# Patient Record
Sex: Female | Born: 1960 | Race: Black or African American | Hispanic: No | Smoking: Never smoker
Health system: Southern US, Community
[De-identification: ages and names within clinical notes are randomized; demographics above are authoritative.]

## PROBLEM LIST (undated history)

## (undated) DIAGNOSIS — M1990 Unspecified osteoarthritis, unspecified site: Secondary | ICD-10-CM

## (undated) DIAGNOSIS — I10 Essential (primary) hypertension: Secondary | ICD-10-CM

## (undated) HISTORY — PX: CHOLECYSTECTOMY: SHX55

## (undated) HISTORY — PX: TUBAL LIGATION: SHX77

---

## 2009-03-11 ENCOUNTER — Emergency Department: Payer: Self-pay | Admitting: Emergency Medicine

## 2011-02-03 ENCOUNTER — Emergency Department: Payer: Self-pay | Admitting: Emergency Medicine

## 2011-11-10 ENCOUNTER — Emergency Department: Payer: Self-pay | Admitting: Emergency Medicine

## 2012-02-20 ENCOUNTER — Emergency Department: Payer: Self-pay | Admitting: Internal Medicine

## 2012-02-20 LAB — URINALYSIS, COMPLETE
Blood: NEGATIVE
Ketone: NEGATIVE
Nitrite: NEGATIVE
Ph: 7 (ref 4.5–8.0)
Protein: NEGATIVE
RBC,UR: 3 /HPF (ref 0–5)
Specific Gravity: 1.006 (ref 1.003–1.030)

## 2012-02-20 LAB — COMPREHENSIVE METABOLIC PANEL
Albumin: 3.8 g/dL (ref 3.4–5.0)
Anion Gap: 9 (ref 7–16)
BUN: 11 mg/dL (ref 7–18)
Calcium, Total: 9.5 mg/dL (ref 8.5–10.1)
Chloride: 105 mmol/L (ref 98–107)
Co2: 28 mmol/L (ref 21–32)
EGFR (African American): >60
Osmolality: 282 (ref 275–301)
Potassium: 4.4 mmol/L (ref 3.5–5.1)
SGOT(AST): 36 U/L (ref 15–37)
SGPT (ALT): 46 U/L (ref 12–78)

## 2012-02-20 LAB — CBC
HGB: 12.7 g/dL (ref 12.0–16.0)
MCH: 25.1 pg — ABNORMAL LOW (ref 26.0–34.0)
MCHC: 33 g/dL (ref 32.0–36.0)
Platelet: 430 10*3/uL (ref 150–440)
RBC: 5.06 10*6/uL (ref 3.80–5.20)
WBC: 8.5 10*3/uL (ref 3.6–11.0)

## 2012-02-20 LAB — LIPASE, BLOOD: Lipase: 155 U/L (ref 73–393)

## 2012-02-20 LAB — CLOSTRIDIUM DIFFICILE BY PCR

## 2012-07-03 ENCOUNTER — Emergency Department: Payer: Self-pay | Admitting: Emergency Medicine

## 2012-11-28 ENCOUNTER — Emergency Department: Payer: Self-pay | Admitting: Internal Medicine

## 2013-01-12 ENCOUNTER — Emergency Department: Payer: Self-pay | Admitting: Emergency Medicine

## 2013-01-12 LAB — URINALYSIS, COMPLETE
Bilirubin,UR: NEGATIVE
Ketone: NEGATIVE
Nitrite: NEGATIVE
Ph: 5 (ref 4.5–8.0)
Protein: NEGATIVE
RBC,UR: 24 /HPF (ref 0–5)
WBC UR: 199 /HPF (ref 0–5)

## 2013-06-07 ENCOUNTER — Ambulatory Visit: Payer: Self-pay | Admitting: Family

## 2013-06-13 ENCOUNTER — Ambulatory Visit: Payer: Self-pay | Admitting: Family

## 2013-06-16 ENCOUNTER — Ambulatory Visit: Payer: Self-pay | Admitting: Family

## 2013-08-08 ENCOUNTER — Ambulatory Visit: Payer: Self-pay | Admitting: Internal Medicine

## 2013-08-23 ENCOUNTER — Ambulatory Visit: Payer: Self-pay | Admitting: Internal Medicine

## 2013-09-22 ENCOUNTER — Ambulatory Visit: Payer: Self-pay | Admitting: Internal Medicine

## 2014-03-24 ENCOUNTER — Ambulatory Visit: Payer: Self-pay | Admitting: Physical Medicine and Rehabilitation

## 2014-07-13 ENCOUNTER — Other Ambulatory Visit (HOSPITAL_COMMUNITY): Payer: Self-pay | Admitting: Neurosurgery

## 2014-07-17 ENCOUNTER — Encounter (HOSPITAL_COMMUNITY): Payer: Self-pay

## 2014-07-17 ENCOUNTER — Encounter (HOSPITAL_COMMUNITY)
Admission: RE | Admit: 2014-07-17 | Discharge: 2014-07-17 | Disposition: A | Discharge status: Home / Self Care | Payer: No Typology Code available for payment source | Source: Ambulatory Visit | Attending: Neurosurgery | Admitting: Neurosurgery

## 2014-07-17 HISTORY — DX: Unspecified osteoarthritis, unspecified site: M19.90

## 2014-07-17 HISTORY — DX: Essential (primary) hypertension: I10

## 2014-07-17 LAB — TYPE AND SCREEN
ABO/RH(D): O POS
Antibody Screen: NEGATIVE

## 2014-07-17 LAB — BASIC METABOLIC PANEL
Anion gap: 9 (ref 5–15)
BUN: 9 mg/dL (ref 6–23)
CALCIUM: 9.3 mg/dL (ref 8.4–10.5)
CHLORIDE: 106 mmol/L (ref 96–112)
CO2: 25 mmol/L (ref 19–32)
CREATININE: 0.86 mg/dL (ref 0.50–1.10)
GFR calc non Af Amer: 76 mL/min — ABNORMAL LOW (ref >90)
GFR, EST AFRICAN AMERICAN: 88 mL/min — AB (ref >90)
Glucose, Bld: 82 mg/dL (ref 70–99)
Potassium: 3.7 mmol/L (ref 3.5–5.1)
Sodium: 140 mmol/L (ref 135–145)

## 2014-07-17 LAB — CBC
HEMATOCRIT: 40.9 % (ref 36.0–46.0)
Hemoglobin: 13.2 g/dL (ref 12.0–15.0)
MCH: 24.8 pg — ABNORMAL LOW (ref 26.0–34.0)
MCHC: 32.3 g/dL (ref 30.0–36.0)
MCV: 76.9 fL — ABNORMAL LOW (ref 78.0–100.0)
Platelets: 290 10*3/uL (ref 150–400)
RBC: 5.32 MIL/uL — AB (ref 3.87–5.11)
RDW: 17.2 % — ABNORMAL HIGH (ref 11.5–15.5)
WBC: 7.2 10*3/uL (ref 4.0–10.5)

## 2014-07-17 LAB — SURGICAL PCR SCREEN
MRSA, PCR: NEGATIVE
STAPHYLOCOCCUS AUREUS: NEGATIVE

## 2014-07-17 LAB — ABO/RH: ABO/RH(D): O POS

## 2014-07-17 NOTE — Pre-Procedure Instructions (Signed)
Peggy Crawford  07/17/2014   Your procedure is scheduled on:  Monday, Feb. 22nd   [facility] at 8:00 AM.             (Arrival time is per your surgeon's request)  Call this number if you have problems the morning of surgery: [phone]   Remember:   Do not eat food or drink liquids after midnight Sunday.   Take these medicines the morning of surgery with A SIP OF WATER: Neurontin   Do not wear jewelry, make-up or nail polish.  Do not wear lotions, powders, or perfumes. You may NOT wear deodorant the day of surgery.  Do not shave underarms & legs 48 hours prior to surgery.    Do not bring valuables to the hospital.  [facility] is not responsible for any belongings or valuables.               Contacts, dentures or bridgework may not be worn into surgery.  Leave suitcase in the car. After surgery it may be brought to your room.  For patients admitted to the hospital, discharge time is determined by your treatment team.               Name and phone number of your driver:    Special Instructions: :"Preparing for Surgery" instruction sheet.   Please read over the following fact sheets that you were given: Pain Booklet, Coughing and Deep Breathing, Blood Transfusion Information, MRSA Information and Surgical Site Infection Prevention

## 2014-07-17 NOTE — Progress Notes (Addendum)
Denies any cardiac problems, has never seen cardio. Her PCP is Dr. Leotis ShamesJasmine Singh @ [facility]  They do NOt have another EKG to compare with today's test.  DA

## 2014-07-18 NOTE — Progress Notes (Signed)
Anesthesia Chart Review:  Patient is a 54 year old female scheduled for L4-5 PLIF on 07/23/14 by Dr. Lovell SheehanJenkins.  History includes non-smoker, arthritis, HTN. BMI is 39, consistent with obesity/borderline morbid obesity.  PCP is Dr. Thedore MinsSingh with [facility], established care on 02/23/14.    07/17/14 EKG: NSR, possible LAE, cannot rule out anterior infarct (age undetermined). There is no comparison EKG at her PCP office, [facility], or [facility]; however, I did obtain [facility].  She has reverse R wave progression in V2-3, otherwise stable since 02/20/12. Inferior leads appear unchanged.  She denied any cardiac issues or ever seeing a cardiologist at her PAT visit. No known   Preoperative labs noted.   If no acute changes or new CV symptoms then I anticipate that she can proceed as planned.  Peggy Ochsllison Shanaiya Bene, PA-C [facility]/Anesthesiology Phone [phone] 07/18/2014 1:58 PM

## 2014-07-22 MED ORDER — CEFAZOLIN SODIUM-DEXTROSE 2-3 GM-% IV SOLR
2.0000 g | INTRAVENOUS | Status: AC
  Administered 2014-07-23: 2 g via INTRAVENOUS
  Filled 2014-07-22: qty 50

## 2014-07-23 ENCOUNTER — Encounter (HOSPITAL_COMMUNITY)
Admission: RE | Disposition: A | Discharge status: Home Health | Payer: No Typology Code available for payment source | Source: Ambulatory Visit | Attending: Neurosurgery

## 2014-07-23 ENCOUNTER — Encounter (HOSPITAL_COMMUNITY): Payer: Self-pay | Admitting: *Deleted

## 2014-07-23 ENCOUNTER — Inpatient Hospital Stay (HOSPITAL_COMMUNITY): Payer: No Typology Code available for payment source

## 2014-07-23 ENCOUNTER — Inpatient Hospital Stay (HOSPITAL_COMMUNITY)
Admission: RE | Admit: 2014-07-23 | Discharge: 2014-07-26 | DRG: 460 | Disposition: A | Discharge status: Home Health | Payer: No Typology Code available for payment source | Source: Ambulatory Visit | Attending: Neurosurgery | Admitting: Neurosurgery

## 2014-07-23 ENCOUNTER — Inpatient Hospital Stay (HOSPITAL_COMMUNITY): Payer: No Typology Code available for payment source | Admitting: Anesthesiology

## 2014-07-23 ENCOUNTER — Inpatient Hospital Stay (HOSPITAL_COMMUNITY): Payer: No Typology Code available for payment source | Admitting: Vascular Surgery

## 2014-07-23 DIAGNOSIS — I10 Essential (primary) hypertension: Secondary | ICD-10-CM | POA: Diagnosis present

## 2014-07-23 DIAGNOSIS — M5116 Intervertebral disc disorders with radiculopathy, lumbar region: Secondary | ICD-10-CM | POA: Diagnosis present

## 2014-07-23 DIAGNOSIS — M1990 Unspecified osteoarthritis, unspecified site: Secondary | ICD-10-CM | POA: Diagnosis present

## 2014-07-23 DIAGNOSIS — Z6839 Body mass index (BMI) 39.0-39.9, adult: Secondary | ICD-10-CM

## 2014-07-23 DIAGNOSIS — R509 Fever, unspecified: Secondary | ICD-10-CM

## 2014-07-23 DIAGNOSIS — M4316 Spondylolisthesis, lumbar region: Principal | ICD-10-CM | POA: Diagnosis present

## 2014-07-23 DIAGNOSIS — M4806 Spinal stenosis, lumbar region: Secondary | ICD-10-CM | POA: Diagnosis present

## 2014-07-23 DIAGNOSIS — M549 Dorsalgia, unspecified: Secondary | ICD-10-CM | POA: Diagnosis present

## 2014-07-23 DIAGNOSIS — Z419 Encounter for procedure for purposes other than remedying health state, unspecified: Secondary | ICD-10-CM

## 2014-07-23 SURGERY — POSTERIOR LUMBAR FUSION 1 LEVEL
Anesthesia: General

## 2014-07-23 MED ORDER — MIDAZOLAM HCL 2 MG/2ML IJ SOLN
INTRAMUSCULAR | Status: AC
  Filled 2014-07-23: qty 2

## 2014-07-23 MED ORDER — PHENYLEPHRINE 40 MCG/ML (10ML) SYRINGE FOR IV PUSH (FOR BLOOD PRESSURE SUPPORT)
PREFILLED_SYRINGE | INTRAVENOUS | Status: AC
  Filled 2014-07-23: qty 10

## 2014-07-23 MED ORDER — NEOSTIGMINE METHYLSULFATE 10 MG/10ML IV SOLN
INTRAVENOUS | Status: DC | PRN
  Administered 2014-07-23: 3 mg via INTRAVENOUS

## 2014-07-23 MED ORDER — DOCUSATE SODIUM 100 MG PO CAPS
100.0000 mg | ORAL_CAPSULE | Freq: Two times a day (BID) | ORAL | Status: DC
  Administered 2014-07-23 – 2014-07-26 (×6): 100 mg via ORAL
  Filled 2014-07-23 (×5): qty 1

## 2014-07-23 MED ORDER — ALUM & MAG HYDROXIDE-SIMETH 200-200-20 MG/5ML PO SUSP: 30.0000 mL | Freq: Four times a day (QID) | ORAL | Status: DC | PRN

## 2014-07-23 MED ORDER — FENTANYL CITRATE 0.05 MG/ML IJ SOLN
INTRAMUSCULAR | Status: AC
  Filled 2014-07-23: qty 5

## 2014-07-23 MED ORDER — PHENOL 1.4 % MT LIQD: 1.0000 | OROMUCOSAL | Status: DC | PRN

## 2014-07-23 MED ORDER — ONDANSETRON HCL 4 MG/2ML IJ SOLN
INTRAMUSCULAR | Status: AC
  Filled 2014-07-23: qty 2

## 2014-07-23 MED ORDER — LACTATED RINGERS IV SOLN: INTRAVENOUS | Status: DC

## 2014-07-23 MED ORDER — ROCURONIUM BROMIDE 100 MG/10ML IV SOLN
INTRAVENOUS | Status: DC | PRN
  Administered 2014-07-23: 50 mg via INTRAVENOUS

## 2014-07-23 MED ORDER — PHENYLEPHRINE HCL 10 MG/ML IJ SOLN
INTRAMUSCULAR | Status: DC | PRN
  Administered 2014-07-23 (×2): 80 ug via INTRAVENOUS
  Administered 2014-07-23: 40 ug via INTRAVENOUS
  Administered 2014-07-23: 80 ug via INTRAVENOUS
  Administered 2014-07-23 (×2): 40 ug via INTRAVENOUS

## 2014-07-23 MED ORDER — GABAPENTIN 300 MG PO CAPS
300.0000 mg | ORAL_CAPSULE | Freq: Two times a day (BID) | ORAL | Status: DC
  Administered 2014-07-23 – 2014-07-26 (×6): 300 mg via ORAL
  Filled 2014-07-23 (×7): qty 1

## 2014-07-23 MED ORDER — PROPOFOL 10 MG/ML IV BOLUS
INTRAVENOUS | Status: DC | PRN
  Administered 2014-07-23: 200 mg via INTRAVENOUS

## 2014-07-23 MED ORDER — ACETAMINOPHEN 650 MG RE SUPP: 650.0000 mg | RECTAL | Status: DC | PRN

## 2014-07-23 MED ORDER — HYDROMORPHONE HCL 1 MG/ML IJ SOLN
INTRAMUSCULAR | Status: AC
  Administered 2014-07-23: 0.5 mg via INTRAVENOUS
  Filled 2014-07-23: qty 1

## 2014-07-23 MED ORDER — LIDOCAINE HCL (CARDIAC) 20 MG/ML IV SOLN
INTRAVENOUS | Status: DC | PRN
  Administered 2014-07-23: 50 mg via INTRAVENOUS

## 2014-07-23 MED ORDER — DIAZEPAM 5 MG PO TABS
5.0000 mg | ORAL_TABLET | Freq: Four times a day (QID) | ORAL | Status: DC | PRN
  Administered 2014-07-23 – 2014-07-26 (×6): 5 mg via ORAL
  Filled 2014-07-23 (×6): qty 1

## 2014-07-23 MED ORDER — MIDAZOLAM HCL 5 MG/5ML IJ SOLN
INTRAMUSCULAR | Status: DC | PRN
  Administered 2014-07-23: 2 mg via INTRAVENOUS

## 2014-07-23 MED ORDER — PHENYLEPHRINE HCL 10 MG/ML IJ SOLN
10.0000 mg | INTRAVENOUS | Status: DC | PRN
  Administered 2014-07-23: 10 ug/min via INTRAVENOUS

## 2014-07-23 MED ORDER — HYDROCODONE-ACETAMINOPHEN 5-325 MG PO TABS
1.0000 | ORAL_TABLET | ORAL | Status: DC | PRN
  Administered 2014-07-25 – 2014-07-26 (×4): 2 via ORAL
  Filled 2014-07-23 (×4): qty 2

## 2014-07-23 MED ORDER — PROPOFOL 10 MG/ML IV BOLUS
INTRAVENOUS | Status: AC
  Filled 2014-07-23: qty 20

## 2014-07-23 MED ORDER — SUCCINYLCHOLINE CHLORIDE 20 MG/ML IJ SOLN
INTRAMUSCULAR | Status: DC | PRN
  Administered 2014-07-23: 100 mg via INTRAVENOUS

## 2014-07-23 MED ORDER — 0.9 % SODIUM CHLORIDE (POUR BTL) OPTIME
TOPICAL | Status: DC | PRN
  Administered 2014-07-23: 1000 mL

## 2014-07-23 MED ORDER — LACTATED RINGERS IV SOLN
INTRAVENOUS | Status: DC
  Administered 2014-07-23 (×2): via INTRAVENOUS

## 2014-07-23 MED ORDER — BUPIVACAINE LIPOSOME 1.3 % IJ SUSP
INTRAMUSCULAR | Status: DC | PRN
  Administered 2014-07-23: 20 mL

## 2014-07-23 MED ORDER — CEFAZOLIN SODIUM-DEXTROSE 2-3 GM-% IV SOLR
2.0000 g | Freq: Three times a day (TID) | INTRAVENOUS | Status: AC
  Administered 2014-07-23 (×2): 2 g via INTRAVENOUS
  Filled 2014-07-23 (×2): qty 50

## 2014-07-23 MED ORDER — SURGIFOAM 100 EX MISC
CUTANEOUS | Status: DC | PRN
  Administered 2014-07-23: 11:00:00 via TOPICAL

## 2014-07-23 MED ORDER — BUPIVACAINE-EPINEPHRINE (PF) 0.5% -1:200000 IJ SOLN
INTRAMUSCULAR | Status: DC | PRN
  Administered 2014-07-23: 10 mL via PERINEURAL

## 2014-07-23 MED ORDER — ACETAMINOPHEN 325 MG PO TABS: 650.0000 mg | ORAL_TABLET | ORAL | Status: DC | PRN

## 2014-07-23 MED ORDER — OXYCODONE-ACETAMINOPHEN 5-325 MG PO TABS
1.0000 | ORAL_TABLET | ORAL | Status: DC | PRN
  Administered 2014-07-23 – 2014-07-26 (×8): 2 via ORAL
  Filled 2014-07-23 (×8): qty 2

## 2014-07-23 MED ORDER — LIDOCAINE HCL (CARDIAC) 20 MG/ML IV SOLN
INTRAVENOUS | Status: AC
  Filled 2014-07-23: qty 5

## 2014-07-23 MED ORDER — SODIUM CHLORIDE 0.9 % IR SOLN
Status: DC | PRN
  Administered 2014-07-23: 11:00:00

## 2014-07-23 MED ORDER — ROCURONIUM BROMIDE 50 MG/5ML IV SOLN
INTRAVENOUS | Status: AC
  Filled 2014-07-23: qty 1

## 2014-07-23 MED ORDER — GLYCOPYRROLATE 0.2 MG/ML IJ SOLN
INTRAMUSCULAR | Status: DC | PRN
Start: 2014-07-23 — End: 2014-07-23
  Administered 2014-07-23: 0.4 mg via INTRAVENOUS

## 2014-07-23 MED ORDER — HYDROMORPHONE HCL 1 MG/ML IJ SOLN
0.2500 mg | INTRAMUSCULAR | Status: DC | PRN
Start: 2014-07-23 — End: 2014-07-23
  Administered 2014-07-23 (×2): 0.5 mg via INTRAVENOUS

## 2014-07-23 MED ORDER — ONDANSETRON HCL 4 MG/2ML IJ SOLN: 4.0000 mg | INTRAMUSCULAR | Status: DC | PRN

## 2014-07-23 MED ORDER — BACITRACIN ZINC 500 UNIT/GM EX OINT
TOPICAL_OINTMENT | CUTANEOUS | Status: DC | PRN
  Administered 2014-07-23: 1 via TOPICAL

## 2014-07-23 MED ORDER — MENTHOL 3 MG MT LOZG: 1.0000 | LOZENGE | OROMUCOSAL | Status: DC | PRN

## 2014-07-23 MED ORDER — ACETAMINOPHEN 10 MG/ML IV SOLN
1000.0000 mg | Freq: Once | INTRAVENOUS | Status: AC
  Administered 2014-07-23: 1000 mg via INTRAVENOUS

## 2014-07-23 MED ORDER — MEPERIDINE HCL 25 MG/ML IJ SOLN: 6.2500 mg | INTRAMUSCULAR | Status: DC | PRN

## 2014-07-23 MED ORDER — PROMETHAZINE HCL 25 MG/ML IJ SOLN: 6.2500 mg | INTRAMUSCULAR | Status: DC | PRN

## 2014-07-23 MED ORDER — ONDANSETRON HCL 4 MG/2ML IJ SOLN
INTRAMUSCULAR | Status: DC | PRN
  Administered 2014-07-23: 4 mg via INTRAVENOUS

## 2014-07-23 MED ORDER — MORPHINE SULFATE 2 MG/ML IJ SOLN: 1.0000 mg | INTRAMUSCULAR | Status: DC | PRN

## 2014-07-23 MED ORDER — FENTANYL CITRATE 0.05 MG/ML IJ SOLN
INTRAMUSCULAR | Status: DC | PRN
  Administered 2014-07-23: 50 ug via INTRAVENOUS
  Administered 2014-07-23: 100 ug via INTRAVENOUS
  Administered 2014-07-23 (×3): 50 ug via INTRAVENOUS

## 2014-07-23 MED ORDER — ACETAMINOPHEN 10 MG/ML IV SOLN
INTRAVENOUS | Status: AC
  Administered 2014-07-23: 1000 mg via INTRAVENOUS
  Filled 2014-07-23: qty 100

## 2014-07-23 MED ORDER — BUPIVACAINE LIPOSOME 1.3 % IJ SUSP
20.0000 mL | Freq: Once | INTRAMUSCULAR | Status: DC
  Filled 2014-07-23: qty 20

## 2014-07-23 SURGICAL SUPPLY — 66 items
BAG DECANTER FOR FLEXI CONT (MISCELLANEOUS) ×2 IMPLANT
BENZOIN TINCTURE PRP APPL 2/3 (GAUZE/BANDAGES/DRESSINGS) ×2 IMPLANT
BLADE CLIPPER SURG (BLADE) IMPLANT
BRUSH SCRUB EZ PLAIN DRY (MISCELLANEOUS) ×2 IMPLANT
BUR MATCHSTICK NEURO 3.0 LAGG (BURR) ×2 IMPLANT
BUR PRECISION FLUTE 6.0 (BURR) ×2 IMPLANT
CANISTER SUCT 3000ML PPV (MISCELLANEOUS) ×2 IMPLANT
CAP REVERE LOCKING (Cap) ×8 IMPLANT
CONT SPEC 4OZ CLIKSEAL STRL BL (MISCELLANEOUS) ×4 IMPLANT
COVER BACK TABLE 60X90IN (DRAPES) ×2 IMPLANT
DRAPE C-ARM 42X72 X-RAY (DRAPES) ×4 IMPLANT
DRAPE LAPAROTOMY 100X72X124 (DRAPES) ×2 IMPLANT
DRAPE POUCH INSTRU U-SHP 10X18 (DRAPES) ×2 IMPLANT
DRAPE PROXIMA HALF (DRAPES) ×2 IMPLANT
DRAPE SURG 17X23 STRL (DRAPES) ×8 IMPLANT
ELECT BLADE 4.0 EZ CLEAN MEGAD (MISCELLANEOUS) ×2
ELECT REM PT RETURN 9FT ADLT (ELECTROSURGICAL) ×2
ELECTRODE BLDE 4.0 EZ CLN MEGD (MISCELLANEOUS) ×1 IMPLANT
ELECTRODE REM PT RTRN 9FT ADLT (ELECTROSURGICAL) ×1 IMPLANT
EVACUATOR 1/8 PVC DRAIN (DRAIN) ×2 IMPLANT
GAUZE SPONGE 4X4 12PLY STRL (GAUZE/BANDAGES/DRESSINGS) ×2 IMPLANT
GAUZE SPONGE 4X4 16PLY XRAY LF (GAUZE/BANDAGES/DRESSINGS) ×2 IMPLANT
GLOVE BIO SURGEON STRL SZ8 (GLOVE) ×2 IMPLANT
GLOVE BIO SURGEON STRL SZ8.5 (GLOVE) ×4 IMPLANT
GLOVE ECLIPSE 7.5 STRL STRAW (GLOVE) ×4 IMPLANT
GLOVE ECLIPSE 9.0 STRL (GLOVE) ×2 IMPLANT
GLOVE EXAM NITRILE LRG STRL (GLOVE) IMPLANT
GLOVE EXAM NITRILE MD LF STRL (GLOVE) IMPLANT
GLOVE EXAM NITRILE XL STR (GLOVE) IMPLANT
GLOVE EXAM NITRILE XS STR PU (GLOVE) IMPLANT
GLOVE INDICATOR 7.0 STRL GRN (GLOVE) ×4 IMPLANT
GLOVE INDICATOR 7.5 STRL GRN (GLOVE) ×4 IMPLANT
GLOVE INDICATOR 8.5 STRL (GLOVE) ×2 IMPLANT
GLOVE SS BIOGEL STRL SZ 8 (GLOVE) ×2 IMPLANT
GLOVE SUPERSENSE BIOGEL SZ 8 (GLOVE) ×2
GLOVE SURG SS PI 7.0 STRL IVOR (GLOVE) ×6 IMPLANT
GOWN STRL REUS W/ TWL LRG LVL3 (GOWN DISPOSABLE) IMPLANT
GOWN STRL REUS W/ TWL XL LVL3 (GOWN DISPOSABLE) ×4 IMPLANT
GOWN STRL REUS W/TWL 2XL LVL3 (GOWN DISPOSABLE) IMPLANT
GOWN STRL REUS W/TWL LRG LVL3 (GOWN DISPOSABLE)
GOWN STRL REUS W/TWL XL LVL3 (GOWN DISPOSABLE) ×4
KIT BASIN OR (CUSTOM PROCEDURE TRAY) ×2 IMPLANT
KIT ROOM TURNOVER OR (KITS) ×2 IMPLANT
NEEDLE HYPO 21X1.5 SAFETY (NEEDLE) ×2 IMPLANT
NEEDLE HYPO 22GX1.5 SAFETY (NEEDLE) ×2 IMPLANT
NS IRRIG 1000ML POUR BTL (IV SOLUTION) ×2 IMPLANT
PACK LAMINECTOMY NEURO (CUSTOM PROCEDURE TRAY) ×2 IMPLANT
PAD ARMBOARD 7.5X6 YLW CONV (MISCELLANEOUS) ×6 IMPLANT
PATTIES SURGICAL .5 X1 (DISPOSABLE) IMPLANT
ROD REVERE 6.35 45MM (Rod) ×4 IMPLANT
SCREW 7.5X45MM (Screw) ×8 IMPLANT
SPACER ALTERA 10X31 10-14-8 (Spacer) ×2 IMPLANT
SPONGE LAP 4X18 X RAY DECT (DISPOSABLE) IMPLANT
SPONGE NEURO XRAY DETECT 1X3 (DISPOSABLE) IMPLANT
SPONGE SURGIFOAM ABS GEL 100 (HEMOSTASIS) ×2 IMPLANT
STRIP BIOACTIVE 20CC 25X100X8 (Miscellaneous) ×2 IMPLANT
STRIP CLOSURE SKIN 1/2X4 (GAUZE/BANDAGES/DRESSINGS) ×2 IMPLANT
SUT VIC AB 1 CT1 18XBRD ANBCTR (SUTURE) ×2 IMPLANT
SUT VIC AB 1 CT1 8-18 (SUTURE) ×2
SUT VIC AB 2-0 CP2 18 (SUTURE) ×4 IMPLANT
SYR 20CC LL (SYRINGE) ×2 IMPLANT
SYR 20ML ECCENTRIC (SYRINGE) ×2 IMPLANT
TOWEL OR 17X24 6PK STRL BLUE (TOWEL DISPOSABLE) ×2 IMPLANT
TOWEL OR 17X26 10 PK STRL BLUE (TOWEL DISPOSABLE) ×2 IMPLANT
TRAY FOLEY CATH 14FRSI W/METER (CATHETERS) ×2 IMPLANT
WATER STERILE IRR 1000ML POUR (IV SOLUTION) ×2 IMPLANT

## 2014-07-23 NOTE — Anesthesia Postprocedure Evaluation (Signed)
Anesthesia Post Note  Patient: Peggy Crawford  Procedure(s) Performed: Procedure(s) (LRB): POSTERIOR LUMBAR INTERBODY FUSION POSTERIOR LATERAL ARTHRODESIS AND POSTERIOR NONSEGMENTAL INSTRUMENTATION LUMBAR FOUR-FIVE (N/A)  Anesthesia type: General  Patient location: PACU  Post pain: Pain level controlled  Post assessment: Post-op Vital signs reviewed  Last Vitals: BP 119/83 mmHg  Pulse 102  Temp(Src) 36.6 C (Oral)  Resp 14  Ht 5' 2.5" (1.588 m)  Wt 219 lb (99.338 kg)  BMI 39.39 kg/m2  SpO2 95%  LMP 10/30/2013 (Approximate)  Post vital signs: Reviewed  Level of consciousness: sedated  Complications: No apparent anesthesia complications

## 2014-07-23 NOTE — H&P (Signed)
Subjective: The patient is a 54 year old black female who has complained of chronic back and leg pain. She has failed medical management and was worked up with a lumbar MRI. This demonstrated a L4-5 spondylolisthesis. I discussed the various treatment options with the patient including surgery. She has weighed the risks, benefits, and alternative surgery and decided proceed with a L4-5 decompression, instrumentation, and fusion.   Past Medical History  Diagnosis Date  . Arthritis   . Hypertension     pcp noted her bp was elevated, but per the patient has not been placed on anything    Past Surgical History  Procedure Laterality Date  . Tubal ligation    . Cholecystectomy      No Known Allergies  History  Substance Use Topics  . Smoking status: Never Smoker   . Smokeless tobacco: Not on file  . Alcohol Use: No    History reviewed. No pertinent family history. Prior to Admission medications   Medication Sig Start Date End Date Taking? Authorizing Provider  diclofenac (VOLTAREN) 75 MG EC tablet Take 75 mg by mouth 2 (two) times daily. 04/16/14  Yes Historical Provider, MD  gabapentin (NEURONTIN) 300 MG capsule Take 300 mg by mouth 2 (two) times daily. 06/26/14  Yes Historical Provider, MD     Review of Systems  Positive ROS: As above  All other systems have been reviewed and were otherwise negative with the exception of those mentioned in the HPI and as above.  Objective: Vital signs in last 24 hours: Temp:  [97.7 F (36.5 C)] 97.7 F (36.5 C) (02/22 0807) Pulse Rate:  [90] 90 (02/22 0807) Resp:  [20] 20 (02/22 0807) BP: (127)/(77) 127/77 mmHg (02/22 0807) SpO2:  [99 %] 99 % (02/22 0807) Weight:  [99.338 kg (219 lb)] 99.338 kg (219 lb) (02/22 0807)  General Appearance: Alert, cooperative, no distress, Head: Normocephalic, without obvious abnormality, atraumatic Eyes: PERRL, conjunctiva/corneas clear, EOM's intact,    Ears: Normal  Throat: Normal  Neck: Supple,  symmetrical, trachea midline, no adenopathy; thyroid: No enlargement/tenderness/nodules; no carotid bruit or JVD Back: Symmetric, no curvature, ROM normal, no CVA tenderness Lungs: Clear to auscultation bilaterally, respirations unlabored Heart: Regular rate and rhythm, no murmur, rub or gallop Abdomen: Soft, non-tender,, no masses, no organomegaly Extremities: Extremities normal, atraumatic, no cyanosis or edema Pulses: 2+ and symmetric all extremities Skin: Skin color, texture, turgor normal, no rashes or lesions  NEUROLOGIC:   Mental status: alert and oriented, no aphasia, good attention span, Fund of knowledge/ memory ok Motor Exam - grossly normal Sensory Exam - grossly normal Reflexes:  Coordination - grossly normal Gait - grossly normal Balance - grossly normal Cranial Nerves: I: smell Not tested  II: visual acuity  OS: Normal  OD: Normal   II: visual fields Full to confrontation  II: pupils Equal, round, reactive to light  III,VII: ptosis None  III,IV,VI: extraocular muscles  Full ROM  V: mastication Normal  V: facial light touch sensation  Normal  V,VII: corneal reflex  Present  VII: facial muscle function - upper  Normal  VII: facial muscle function - lower Normal  VIII: hearing Not tested  IX: soft palate elevation  Normal  IX,X: gag reflex Present  XI: trapezius strength  5/5  XI: sternocleidomastoid strength 5/5  XI: neck flexion strength  5/5  XII: tongue strength  Normal    Data Review Lab Results  Component Value Date   WBC 7.2 07/17/2014   HGB 13.2 07/17/2014   HCT  40.9 07/17/2014   MCV 76.9* 07/17/2014   PLT 290 07/17/2014   Lab Results  Component Value Date   NA 140 07/17/2014   K 3.7 07/17/2014   CL 106 07/17/2014   CO2 25 07/17/2014   BUN 9 07/17/2014   CREATININE 0.86 07/17/2014   GLUCOSE 82 07/17/2014   No results found for: INR, PROTIME  Assessment/Plan: L4-5 spondylolisthesis, spinal stenosis, lumbago, lumbar radiculopathy: I have  discussed the situation with the patient. I have reviewed her imaging studies with her and pointed out the abnormalities. We have discussed the various treatment options including surgery. I have described the surgical treatment option of a L4-5 decompression, instrumentation, and fusion. I have shown her surgical models. We have discussed the risks, benefits, alternatives, and likelihood of achieving her goals with surgery. I have answered all the patient's questions. She has decided to proceed with surgery.   Leotha Voeltz D 07/23/2014 10:06 AM

## 2014-07-23 NOTE — Anesthesia Procedure Notes (Signed)
Procedure Name: Intubation Date/Time: 07/23/2014 10:52 AM Performed by: Minus LibertyAVENEL, Natalee Tomkiewicz Pre-anesthesia Checklist: Patient identified, Emergency Drugs available, Suction available, Patient being monitored and Timeout performed Patient Re-evaluated:Patient Re-evaluated prior to inductionOxygen Delivery Method: Circle system utilized Preoxygenation: Pre-oxygenation with 100% oxygen Intubation Type: IV induction Ventilation: Mask ventilation without difficulty Laryngoscope Size: Mac and 4 Grade View: Grade I Tube type: Oral Tube size: 7.0 mm Number of attempts: 1 Airway Equipment and Method: Stylet Placement Confirmation: ETT inserted through vocal cords under direct vision,  positive ETCO2 and breath sounds checked- equal and bilateral Secured at: 21 cm Tube secured with: Tape Dental Injury: Teeth and Oropharynx as per pre-operative assessment

## 2014-07-23 NOTE — Progress Notes (Signed)
Subjective:  The patient is somnolent but easily arousable. She is in no apparent distress. She looks well.  Objective: Vital signs in last 24 hours: Temp:  [97.7 F (36.5 C)-97.9 F (36.6 C)] 97.9 F (36.6 C) (02/22 1453) Pulse Rate:  [90-113] 102 (02/22 1515) Resp:  [19-22] 19 (02/22 1515) BP: (126-127)/(77-84) 126/84 mmHg (02/22 1456) SpO2:  [94 %-99 %] 94 % (02/22 1515) Weight:  [99.338 kg (219 lb)] 99.338 kg (219 lb) (02/22 0807)  Intake/Output from previous day:   Intake/Output this shift: Total I/O In: 1300 [I.V.:1300] Out: 930 [Urine:780; Blood:150]  Physical exam the patient is somewhat but easily arousable. She is moving her lower extremities well.  Lab Results: No results for input(s): WBC, HGB, HCT, PLT in the last 72 hours. BMET No results for input(s): NA, K, CL, CO2, GLUCOSE, BUN, CREATININE, CALCIUM in the last 72 hours.  Studies/Results: Dg Lumbar Spine 2-3 Views  07/23/2014   CLINICAL DATA:  Spinal fusion at L4-L5. Chronic back pain. L4-L5 spondylolisthesis.  EXAM: DG C-ARM 61-120 MIN; LUMBAR SPINE - 2-3 VIEW  COMPARISON:  07/03/2014  FINDINGS: Placement of bilateral pedicle screws at L4 and L5. Interbody device at L4-L5.  IMPRESSION: Surgical fusion at L4-L5.   Electronically Signed   By: Richarda OverlieAdam  Henn M.D.   On: 07/23/2014 14:57   Dg Lumbar Spine 1 View  07/23/2014   CLINICAL DATA:  Operative localization imaging for lumbar spine fusion.  EXAM: LUMBAR SPINE - 1 VIEW  COMPARISON:  07/03/2014  FINDINGS: Surgical probe has been inserted between posterior skin retractors. Lies just below the posterior spinous process of L4, posterior to the posterior upper endplate of L5.  IMPRESSION: Surgical localization imaging as described.   Electronically Signed   By: Amie Portlandavid  Ormond M.D.   On: 07/23/2014 12:37   Dg C-arm 1-60 Min  07/23/2014   CLINICAL DATA:  Spinal fusion at L4-L5. Chronic back pain. L4-L5 spondylolisthesis.  EXAM: DG C-ARM 61-120 MIN; LUMBAR SPINE - 2-3 VIEW   COMPARISON:  07/03/2014  FINDINGS: Placement of bilateral pedicle screws at L4 and L5. Interbody device at L4-L5.  IMPRESSION: Surgical fusion at L4-L5.   Electronically Signed   By: Richarda OverlieAdam  Henn M.D.   On: 07/23/2014 14:57    Assessment/Plan: The patient is doing well. I spoke with her daughter.  LOS: 0 days     Eniola Cerullo D 07/23/2014, 3:31 PM

## 2014-07-23 NOTE — Progress Notes (Signed)
Orthopedic Tech Progress Note Patient Details:  Peggy Crawford 07/18/1960 [id number] Called in order to [employer].     Lesle ChrisGilliland, Tayvien Kane L 07/23/2014, 7:34 PM

## 2014-07-23 NOTE — Op Note (Signed)
Brief history: The patient is a 54 year old white female who has complained of back and leg pain consistent with neurogenic claudication. She has failed medical management and was worked up with a lumbar x-ray and a lumbar MRI and was diagnosed with an L4-5 spondylolisthesis and spinal stenosis. I discussed the various treatment options including surgery. The patient has weighed the risks, benefits, and alternative surgery and decided proceed with a L4-5 decompression, instrumentation, and fusion.  Preoperative diagnosis: L4-5 spondylolisthesis, Degenerative disc disease, spinal stenosis compressing both the L4 and the L5 nerve roots; lumbago; lumbar radiculopathy; neurogenic claudication  Postoperative diagnosis: The same  Procedure: Bilateral L4 Laminotomy/foraminotomies to decompress the bilateral L4 and L5 nerve roots(the work required to do this was in addition to the work required to do the posterior lumbar interbody fusion because of the patient's spinal stenosis, facet arthropathy. Etc. requiring a wide decompression of the nerve roots.); L4-5 posterior lumbar interbody fusion with local morselized autograft bone and Kinnex graft extender; insertion of interbody prosthesis at L4-5 (globus peek interbody prosthesis); posterior nonsegmental instrumentation from L4 to L5 with globus titanium pedicle screws and rods; posterior lateral arthrodesis at L4-5 with local morselized autograft bone and Kinnex bone graft extender.  Surgeon: Dr. Delma Officer  Asst.: Dr. Altamease Oiler  Anesthesia: Gen. endotracheal  Estimated blood loss: 200 mL  Drains: One medium Hemovac  Complications: None  Description of procedure: The patient was brought to the operating room by the anesthesia team. General endotracheal anesthesia was induced. The patient was turned to the prone position on the Evola frame. The patient's lumbosacral region was then prepared with Betadine scrub and Betadine solution. Sterile drapes  were applied.  I then injected the area to be incised with Marcaine with epinephrine solution. I then used the scalpel to make a linear midline incision over the L4-5 interspace. I then used electrocautery to perform a bilateral subperiosteal dissection exposing the spinous process and lamina of L4 and L5. We then obtained intraoperative radiograph to confirm our location. We then inserted the Verstrac retractor to provide exposure.  I began the decompression by using the high speed drill to perform laminotomies at L4 bilaterally. We then used the Kerrison punches to widen the laminotomy and removed the ligamentum flavum at L4-5 bilaterally. We used the Kerrison punches to remove the medial facets at L4-5 and remove the cephalad aspect of the L5 lamina. We performed wide foraminotomies about the bilateral L4 and L5 nerve roots completing the decompression.  We now turned our attention to the posterior lumbar interbody fusion. I used a scalpel to incise the intervertebral disc at L4-5. I then performed a partial intervertebral discectomy at L4-5 using the pituitary forceps. We prepared the vertebral endplates at L4-5 for the fusion by removing the soft tissues with the curettes. We then used the trial spacers to pick the appropriate sized interbody prosthesis. We prefilled his prosthesis with a combination of local morselized autograft bone that we obtained during the decompression as well as Kinnex bone graft extender. We inserted the prefilled prosthesis into the interspace at L4-5. There was a good snug fit of the prosthesis in the interspace. We then filled and the remainder of the intervertebral disc space with local morselized autograft bone and Kinnex. This completed the posterior lumbar interbody arthrodesis.  We now turned attention to the instrumentation. Under fluoroscopic guidance we cannulated the bilateral L4 and L5 pedicles with the bone probe. We then removed the bone probe. We then tapped the  pedicle with  a 6.5 millimeter tap. We then removed the tap. We probed inside the tapped pedicle with a ball probe to rule out cortical breaches. We then inserted a 7.5 x 45 millimeter pedicle screw into the L4 and L5 pedicles bilaterally under fluoroscopic guidance. We then palpated along the medial aspect of the pedicles to rule out cortical breaches. There were none. The nerve roots were not injured. We then connected the unilateral pedicle screws with a lordotic rod. We compressed the construct and secured the rod in place with the caps. We then tightened the caps appropriately. This completed the instrumentation from L4-5.  We now turned our attention to the posterior lateral arthrodesis at L4-5. We used the high-speed drill to decorticate the remainder of the facets, pars, transverse process at L4-5. We then applied a combination of local morselized autograft bone and Kinnex bone graft extender over these decorticated posterior lateral structures. This completed the posterior lateral arthrodesis.  We then obtained hemostasis using bipolar electrocautery. We irrigated the wound out with bacitracin solution. We inspected the thecal sac and nerve roots and noted they were well decompressed. We then removed the retractor. We placed a medium Hemovac drain in the epidural space and tunneled out through separate stab wound. We reapproximated patient's thoracolumbar fascia with interrupted #1 Vicryl suture. We reapproximated patient's subcutaneous tissue with interrupted 2-0 Vicryl suture. The reapproximated patient's skin with Steri-Strips and benzoin. The wound was then coated with bacitracin ointment. A sterile dressing was applied. The drapes were removed. The patient was subsequently returned to the supine position where they were extubated by the anesthesia team. He was then transported to the post anesthesia care unit in stable condition. All sponge instrument and needle counts were reportedly correct at the  end of this case.

## 2014-07-23 NOTE — Transfer of Care (Signed)
Immediate Anesthesia Transfer of Care Note  Patient: Peggy Crawford  Procedure(s) Performed: Procedure(s): POSTERIOR LUMBAR INTERBODY FUSION POSTERIOR LATERAL ARTHRODESIS AND POSTERIOR NONSEGMENTAL INSTRUMENTATION LUMBAR FOUR-FIVE (N/A)  Patient Location: PACU  Anesthesia Type:General  Level of Consciousness: awake, alert , oriented and patient cooperative  Airway & Oxygen Therapy: Patient Spontanous Breathing and Patient connected to nasal cannula oxygen  Post-op Assessment: Report given to RN, Post -op Vital signs reviewed and stable and Patient moving all extremities X 4  Post vital signs: Reviewed and stable  Last Vitals:  Filed Vitals:   07/23/14 1456  BP: 126/84  Pulse: 113  Temp:   Resp:     Complications: No apparent anesthesia complications

## 2014-07-23 NOTE — Anesthesia Preprocedure Evaluation (Addendum)
Anesthesia Evaluation  Patient identified by MRN, date of birth, ID band Patient awake    Reviewed: Allergy & Precautions, NPO status , Patient's Chart, lab work & pertinent test results  Airway Mallampati: II  TM Distance: >3 FB Neck ROM: Full    Dental no notable dental hx. (+) Teeth Intact   Pulmonary neg pulmonary ROS,  breath sounds clear to auscultation  Pulmonary exam normal       Cardiovascular hypertension, Rhythm:Regular Rate:Normal     Neuro/Psych negative neurological ROS  negative psych ROS   GI/Hepatic negative GI ROS, Neg liver ROS,   Endo/Other  Morbid obesity  Renal/GU negative Renal ROS     Musculoskeletal  (+) Arthritis -,   Abdominal   Peds  Hematology negative hematology ROS (+)   Anesthesia Other Findings   Reproductive/Obstetrics negative OB ROS                            Anesthesia Physical Anesthesia Plan  ASA: II  Anesthesia Plan: General   Post-op Pain Management:    Induction: Intravenous  Airway Management Planned: Oral ETT  Additional Equipment: None  Intra-op Plan:   Post-operative Plan: Extubation in OR  Informed Consent: I have reviewed the patients History and Physical, chart, labs and discussed the procedure including the risks, benefits and alternatives for the proposed anesthesia with the patient or authorized representative who has indicated his/her understanding and acceptance.   Dental advisory given  Plan Discussed with: CRNA  Anesthesia Plan Comments:         Anesthesia Quick Evaluation

## 2014-07-24 LAB — CBC
HCT: 35.5 % — ABNORMAL LOW (ref 36.0–46.0)
Hemoglobin: 11.1 g/dL — ABNORMAL LOW (ref 12.0–15.0)
MCH: 24.6 pg — AB (ref 26.0–34.0)
MCHC: 31.3 g/dL (ref 30.0–36.0)
MCV: 78.5 fL (ref 78.0–100.0)
Platelets: 266 10*3/uL (ref 150–400)
RBC: 4.52 MIL/uL (ref 3.87–5.11)
RDW: 17.3 % — AB (ref 11.5–15.5)
WBC: 8 10*3/uL (ref 4.0–10.5)

## 2014-07-24 LAB — BASIC METABOLIC PANEL
Anion gap: 4 — ABNORMAL LOW (ref 5–15)
BUN: 8 mg/dL (ref 6–23)
CHLORIDE: 104 mmol/L (ref 96–112)
CO2: 31 mmol/L (ref 19–32)
CREATININE: 0.93 mg/dL (ref 0.50–1.10)
Calcium: 8.8 mg/dL (ref 8.4–10.5)
GFR calc non Af Amer: 69 mL/min — ABNORMAL LOW (ref >90)
GFR, EST AFRICAN AMERICAN: 80 mL/min — AB (ref >90)
Glucose, Bld: 100 mg/dL — ABNORMAL HIGH (ref 70–99)
Potassium: 3.7 mmol/L (ref 3.5–5.1)
Sodium: 139 mmol/L (ref 135–145)

## 2014-07-24 NOTE — Evaluation (Signed)
Physical Therapy Evaluation Patient Details Name: Peggy Cheersatricia A Soo MRN: [id number] DOB: 09/01/1960 Today's Date: 07/24/2014   History of Present Illness  pt presents with L4-5 PLIF.    Clinical Impression  Pt moving well despite being apprehensive about mobility.  Pt will have good support from her family at D/C and will only need HHPT and RW.  Will continue to follow.      Follow Up Recommendations Home health PT;Supervision - Intermittent    Equipment Recommendations  Rolling walker with 5" wheels    Recommendations for Other Services       Precautions / Restrictions Precautions Precautions: Back;Fall Precaution Booklet Issued: No Precaution Comments: Reviewed back precautions. Required Braces or Orthoses: Spinal Brace Spinal Brace: Lumbar corset;Applied in sitting position Restrictions Weight Bearing Restrictions: No      Mobility  Bed Mobility Overal bed mobility: Needs Assistance Bed Mobility: Rolling;Sidelying to Sit Rolling: Supervision Sidelying to sit: Supervision       General bed mobility comments: cues for log roll technique.    Transfers Overall transfer level: Needs assistance Equipment used: Rolling walker (2 wheeled) Transfers: Sit to/from Stand Sit to Stand: Min guard         General transfer comment: Cues for UE use only.    Ambulation/Gait Ambulation/Gait assistance: Min guard Ambulation Distance (Feet): 80 Feet Assistive device: Rolling walker (2 wheeled) Gait Pattern/deviations: Step-through pattern;Decreased stride length     General Gait Details: pt moves slowly and very guarded gait.  pt needs encouragement for increasing ambulation distance.    Stairs            Wheelchair Mobility    Modified Rankin (Stroke Patients Only)       Balance Overall balance assessment: Needs assistance Sitting-balance support: No upper extremity supported;Feet supported Sitting balance-Leahy Scale: Fair     Standing balance support:  Single extremity supported Standing balance-Leahy Scale: Poor                               Pertinent Vitals/Pain Pain Assessment: 0-10 Pain Score: 8  Pain Location: Back Pain Descriptors / Indicators: Aching;Guarding Pain Intervention(s): Monitored during session;Premedicated before session;Repositioned    Home Living Family/patient expects to be discharged to:: Private residence Living Arrangements: Children Available Help at Discharge: Family;Available 24 hours/day Type of Home: House Home Access: Stairs to enter Entrance Stairs-Rails: Left Entrance Stairs-Number of Steps: 3 Home Layout: One level Home Equipment: None Additional Comments: pt to stay at her daughter's home.      Prior Function Level of Independence: Independent               Hand Dominance        Extremity/Trunk Assessment   Upper Extremity Assessment: Overall WFL for tasks assessed           Lower Extremity Assessment: Generalized weakness      Cervical / Trunk Assessment: Normal  Communication   Communication: No difficulties  Cognition Arousal/Alertness: Awake/alert Behavior During Therapy: WFL for tasks assessed/performed Overall Cognitive Status: Within Functional Limits for tasks assessed                      General Comments      Exercises        Assessment/Plan    PT Assessment Patient needs continued PT services  PT Diagnosis Difficulty walking;Acute pain   PT Problem List Decreased strength;Decreased activity tolerance;Decreased balance;Decreased mobility;Decreased knowledge of use  of DME;Pain;Decreased knowledge of precautions  PT Treatment Interventions DME instruction;Gait training;Stair training;Functional mobility training;Therapeutic activities;Therapeutic exercise;Balance training;Patient/family education   PT Goals (Current goals can be found in the Care Plan section) Acute Rehab PT Goals Patient Stated Goal: Move better.   PT Goal  Formulation: With patient Time For Goal Achievement: 07/31/14 Potential to Achieve Goals: Good    Frequency Min 5X/week   Barriers to discharge        Co-evaluation               End of Session Equipment Utilized During Treatment: Gait belt;Back brace Activity Tolerance: Patient tolerated treatment well Patient left: in bed;with call bell/phone within reach (Sitting EOB.) Nurse Communication: Mobility status         Time: 1610-9604 PT Time Calculation (min) (ACUTE ONLY): 29 min   Charges:   PT Evaluation $Initial PT Evaluation Tier I: 1 Procedure PT Treatments $Gait Training: 8-22 mins   PT G CodesSunny Schlein, [place] [phone] 07/24/2014, 3:39 PM

## 2014-07-24 NOTE — Progress Notes (Signed)
Patient ID: Peggy Crawford, female   DOB: 01/30/1961, 54 y.o.   MRN: [id number] Subjective:  The patient is alert and pleasant. She looks well. Her back is appropriately sore.  Objective: Vital signs in last 24 hours: Temp:  [97.5 F (36.4 C)-98.3 F (36.8 C)] 98.2 F (36.8 C) (02/23 0413) Pulse Rate:  [90-113] 95 (02/23 0413) Resp:  [11-22] 18 (02/23 0413) BP: (109-139)/(58-90) 112/67 mmHg (02/23 0413) SpO2:  [93 %-100 %] 95 % (02/23 0413) Weight:  [99.338 kg (219 lb)] 99.338 kg (219 lb) (02/22 0807)  Intake/Output from previous day: 02/22 0701 - 02/23 0700 In: 1300 [I.V.:1300] Out: 1640 [Urine:1305; Drains:185; Blood:150] Intake/Output this shift:    Physical exam the patient is alert and oriented 3. She is moving her lower extremities well.  Lab Results:  Recent Labs  07/24/14 0416  WBC 8.0  HGB 11.1*  HCT 35.5*  PLT 266   BMET  Recent Labs  07/24/14 0416  NA 139  K 3.7  CL 104  CO2 31  GLUCOSE 100*  BUN 8  CREATININE 0.93  CALCIUM 8.8    Studies/Results: Dg Lumbar Spine 2-3 Views  07/23/2014   CLINICAL DATA:  Spinal fusion at L4-L5. Chronic back pain. L4-L5 spondylolisthesis.  EXAM: DG C-ARM 61-120 MIN; LUMBAR SPINE - 2-3 VIEW  COMPARISON:  07/03/2014  FINDINGS: Placement of bilateral pedicle screws at L4 and L5. Interbody device at L4-L5.  IMPRESSION: Surgical fusion at L4-L5.   Electronically Signed   By: Richarda OverlieAdam  Henn M.D.   On: 07/23/2014 14:57   Dg Lumbar Spine 1 View  07/23/2014   CLINICAL DATA:  Operative localization imaging for lumbar spine fusion.  EXAM: LUMBAR SPINE - 1 VIEW  COMPARISON:  07/03/2014  FINDINGS: Surgical probe has been inserted between posterior skin retractors. Lies just below the posterior spinous process of L4, posterior to the posterior upper endplate of L5.  IMPRESSION: Surgical localization imaging as described.   Electronically Signed   By: Amie Portlandavid  Ormond M.D.   On: 07/23/2014 12:37   Dg C-arm 1-60 Min  07/23/2014   CLINICAL  DATA:  Spinal fusion at L4-L5. Chronic back pain. L4-L5 spondylolisthesis.  EXAM: DG C-ARM 61-120 MIN; LUMBAR SPINE - 2-3 VIEW  COMPARISON:  07/03/2014  FINDINGS: Placement of bilateral pedicle screws at L4 and L5. Interbody device at L4-L5.  IMPRESSION: Surgical fusion at L4-L5.   Electronically Signed   By: Richarda OverlieAdam  Henn M.D.   On: 07/23/2014 14:57    Assessment/Plan: Postoperative day #1: The patient is doing well. We will mobilize her with PT. She will likely go home tomorrow.  LOS: 1 day     Dafna Romo D 07/24/2014, 7:38 AM

## 2014-07-25 ENCOUNTER — Inpatient Hospital Stay (HOSPITAL_COMMUNITY): Payer: No Typology Code available for payment source

## 2014-07-25 LAB — URINALYSIS, ROUTINE W REFLEX MICROSCOPIC
Bilirubin Urine: NEGATIVE
Glucose, UA: NEGATIVE mg/dL
Ketones, ur: NEGATIVE mg/dL
LEUKOCYTES UA: NEGATIVE
Nitrite: NEGATIVE
PH: 7 (ref 5.0–8.0)
Protein, ur: NEGATIVE mg/dL
SPECIFIC GRAVITY, URINE: 1.01 (ref 1.005–1.030)
Urobilinogen, UA: 1 mg/dL (ref 0.0–1.0)

## 2014-07-25 LAB — URINE MICROSCOPIC-ADD ON

## 2014-07-25 LAB — CBC
HCT: 32.5 % — ABNORMAL LOW (ref 36.0–46.0)
HEMOGLOBIN: 11 g/dL — AB (ref 12.0–15.0)
MCH: 25.6 pg — AB (ref 26.0–34.0)
MCHC: 33.8 g/dL (ref 30.0–36.0)
MCV: 75.6 fL — AB (ref 78.0–100.0)
Platelets: 281 10*3/uL (ref 150–400)
RBC: 4.3 MIL/uL (ref 3.87–5.11)
RDW: 16.8 % — ABNORMAL HIGH (ref 11.5–15.5)
WBC: 14.2 10*3/uL — ABNORMAL HIGH (ref 4.0–10.5)

## 2014-07-25 MED FILL — Sodium Chloride IV Soln 0.9%: INTRAVENOUS | Qty: 1000 | Status: AC

## 2014-07-25 MED FILL — Heparin Sodium (Porcine) Inj 1000 Unit/ML: INTRAMUSCULAR | Qty: 30 | Status: AC

## 2014-07-25 NOTE — Evaluation (Addendum)
Occupational Therapy Evaluation Patient Details Name: Peggy Crawford MRN: [id number] DOB: 06/04/1960 Today's Date: 07/25/2014    History of Present Illness pt presents with L4-5 PLIF.     Clinical Impression   Pt s/p above. Pt independent with ADLs, PTA. Feel pt will benefit from acute OT to increase independence prior to d/c. Plan to practice with AE for LB dressing next session.  Follow Up Recommendations  No OT follow up;Supervision/Assistance - 24 hour    Equipment Recommendations  3 in 1 bedside comode;Tub/shower bench;Other (comment) (AE)    Recommendations for Other Services       Precautions / Restrictions Precautions Precautions: Back;Fall Precaution Booklet Issued: No Precaution Comments: Reviewed back precautions. Required Braces or Orthoses: Spinal Brace Spinal Brace: Lumbar corset;Applied in sitting position;Applied in standing position (adjusted in standing) Restrictions Weight Bearing Restrictions: No      Mobility Bed Mobility Overal bed mobility: Needs Assistance Bed Mobility: Rolling;Sidelying to Sit Rolling: Supervision Sidelying to sit: Supervision       General bed mobility comments: cues for technique.  Transfers Overall transfer level: Needs assistance Equipment used: Rolling walker (2 wheeled) Transfers: Sit to/from Stand Sit to Stand: Min guard         General transfer comment: cues for technique.    Balance  Min guard for ambulation with RW.                                          ADL Overall ADL's : Needs assistance/impaired      Eating: Sitting; Independent           Upper Body Dressing : Min guard;Sitting;Standing   Lower Body Dressing: Maximal assistance;Sit to/from stand   Toilet Transfer: Min guard;Ambulation;RW (chair/bed)           Functional mobility during ADLs: Min guard;Rolling walker General ADL Comments: Educated on LB dressing technique and AE/cost/where she can purchase. Pt  did not want to practice today-limited by pain and lethargy. Educated on back brace. Educated on safety such as rugs/items on floor and use of bag on walker. Discussed incorporating precautions into functional activities. Educated on tub transfer technique and options for shower chair.      Vision     Perception     Praxis      Pertinent Vitals/Pain Pain Assessment: 0-10 Pain Score: 8  Pain Location: back Pain Descriptors / Indicators: Sore Pain Intervention(s): Limited activity within patient's tolerance;Repositioned;Monitored during session     Hand Dominance     Extremity/Trunk Assessment Upper Extremity Assessment Upper Extremity Assessment: Overall WFL for tasks assessed   Lower Extremity Assessment Lower Extremity Assessment: Defer to PT evaluation       Communication Communication Communication: No difficulties   Cognition Arousal/Alertness: Awake/alert Behavior During Therapy: WFL for tasks assessed/performed Overall Cognitive Status:  (unsure of baseline) Area of Impairment: Problem solving             Problem Solving: Slow processing     General Comments       Exercises       Shoulder Instructions      Home Living Family/patient expects to be discharged to:: Private residence Living Arrangements: Children Available Help at Discharge: Family;Available 24 hours/day (for two weeks) Type of Home: House Home Access: Stairs to enter Entergy CorporationEntrance Stairs-Number of Steps: 3 Entrance Stairs-Rails: Left Home Layout: One level     Bathroom  Shower/Tub: Chief Strategy Officer: Standard     Home Equipment: None   Additional Comments: pt to stay at her daughter's home.        Prior Functioning/Environment Level of Independence: Independent             OT Diagnosis: Acute pain;Generalized weakness   OT Problem List: Decreased strength;Decreased range of motion;Decreased activity tolerance;Decreased knowledge of use of DME or  AE;Decreased knowledge of precautions;Decreased cognition;Pain   OT Treatment/Interventions: DME and/or AE instruction;Self-care/ADL training;Therapeutic activities;Balance training;Patient/family education    OT Goals(Current goals can be found in the care plan section) Acute Rehab OT Goals Patient Stated Goal: not stated OT Goal Formulation: With patient Time For Goal Achievement: 08/01/14 Potential to Achieve Goals: Good ADL Goals Pt Will Perform Grooming: standing;with set-up Pt Will Perform Lower Body Dressing: with set-up;with supervision;with adaptive equipment;sit to/from stand Pt Will Transfer to Toilet: with modified independence;ambulating Pt Will Perform Tub/Shower Transfer: Tub transfer;with supervision;ambulating;tub bench;rolling walker Additional ADL Goal #1: Pt will independently verbalize and demonstrate 3/3 back precautions.  OT Frequency: Min 2X/week   Barriers to D/C:            Co-evaluation              End of Session Equipment Utilized During Treatment: Gait belt;Rolling walker;Back brace Nurse Communication: Other (comment) (d/c recommendation and equipment )  Activity Tolerance: Patient limited by lethargy;Patient limited by pain Patient left: in chair;with call bell/phone within reach   Time: 1319-1335 OT Time Calculation (min): 16 min Charges:  OT General Charges $OT Visit: 1 Procedure OT Evaluation $Initial OT Evaluation Tier I: 1 Procedure G-CodesEarlie Raveling OTR/L [phone] 07/25/2014, 1:50 PM

## 2014-07-25 NOTE — Addendum Note (Signed)
Addendum  created 07/25/14 1555 by Gaylan GeroldJohn R Mayford Alberg, MD   Modules edited: Anesthesia Attestations

## 2014-07-25 NOTE — Progress Notes (Signed)
Physical Therapy Treatment Patient Details Name: Peggy Crawford MRN: [id number] DOB: [birth year] Today's Date: 07/25/2014    History of Present Illness pt presents with L4-5 PLIF.      PT Comments    Pt admitted with above diagnosis. Pt currently with functional limitations due to balance and endurance deficits as well as need for further education re: back precautions.  Pt may need HHPT safety eval as treatment limited today due to dizziness.  Mobility varies from day to day.  Will need RW and 3N1.    Pt will benefit from skilled PT to increase their independence and safety with mobility to allow discharge to the venue listed below.    Follow Up Recommendations  Home health PT;Supervision - Intermittent (safety eval)     Equipment Recommendations  Rolling walker with 5" wheels;3in1 (PT);Other (comment) (OT to determine bath equipment)    Recommendations for Other Services       Precautions / Restrictions Precautions Precautions: Back;Fall Precaution Booklet Issued: Yes (comment) Precaution Comments: Reviewed back precautions. Required Braces or Orthoses: Spinal Brace Spinal Brace: Lumbar corset;Applied in sitting position;Applied in standing position (adjusted in standing) Restrictions Weight Bearing Restrictions: No    Mobility  Bed Mobility Overal bed mobility: Needs Assistance Bed Mobility: Sit to Sidelying;Rolling Rolling: Min assist Sidelying to sit: Supervision     Sit to sidelying: Min assist General bed mobility comments: On arrival, educated fully on back precaution handout.  Pt needs reenforcement.  Had to assist pt back to bed after pt had an episode of incr HR and dizziness with ambulation.  Cues to log roll technique.  Pt needed asisst to follow precautions.    Transfers Overall transfer level: Needs assistance Equipment used: Rolling walker (2 wheeled) Transfers: Sit to/from Stand Sit to Stand: Min assist         General transfer comment: Pt needed  cues for technique and incr time to perform sit to stand from recliner.  Had dozed off in chair and had not moved in awhile.   Ambulation/Gait Ambulation/Gait assistance: Min assist Ambulation Distance (Feet): 10 Feet Assistive device: Rolling walker (2 wheeled) Gait Pattern/deviations: Step-through pattern;Decreased stride length;Antalgic;Wide base of support   Gait velocity interpretation: Below normal speed for age/gender General Gait Details: pt moves slowly and very guarded gait.  Got around the bed and the patient c/o dizziness.  Had pt step backwards tobed and obtained BP monitor.  HR was 128 bpm (from 113 bpm).  BP was 145/93.  Called nursing into room and decided to let pt lie down.  Took BP again and BP 108/64.  HR down to 114 bpm once pt resting in bed.  O2 96% on RA.  Nursing to monitor pt.  Treatment terminated so pt could rest.     Stairs            Wheelchair Mobility    Modified Rankin (Stroke Patients Only)       Balance Overall balance assessment: Needs assistance Sitting-balance support: No upper extremity supported;Feet supported Sitting balance-Leahy Scale: Fair     Standing balance support: Bilateral upper extremity supported;During functional activity Standing balance-Leahy Scale: Poor Standing balance comment: Needed support of RW for balance in standing.                     Cognition Arousal/Alertness: Awake/alert Behavior During Therapy: WFL for tasks assessed/performed Overall Cognitive Status: Within Functional Limits for tasks assessed Area of Impairment: Problem solving  Problem Solving: Slow processing      Exercises      General Comments        Pertinent Vitals/Pain Pain Assessment: 0-10 Pain Score: 8  Pain Location: back Pain Descriptors / Indicators: Aching;Sore Pain Intervention(s): Limited activity within patient's tolerance;Monitored during session;Repositioned  HR was 128 bpm (from 113 bpm).  BP  was 145/93.  Called nursing into room and decided to let pt lie down.  Took BP again and BP 108/64.  HR down to 114 bpm once pt resting in bed.  O2 96% on RA.  Nursing to monitor pt.  Treatment terminated so pt could rest.         Home Living Family/patient expects to be discharged to:: Private residence Living Arrangements: Children Available Help at Discharge: Family;Available 24 hours/day (for two weeks) Type of Home: House Home Access: Stairs to enter Entrance Stairs-Rails: Left Home Layout: One level Home Equipment: None Additional Comments: pt to stay at her daughter's home.      Prior Function Level of Independence: Independent          PT Goals (current goals can now be found in the care plan section) Acute Rehab PT Goals Patient Stated Goal: not stated Progress towards PT goals: Not progressing toward goals - comment (due to BP/dizziness)    Frequency  Min 5X/week    PT Plan Current plan remains appropriate    Co-evaluation             End of Session Equipment Utilized During Treatment: Gait belt;Back brace Activity Tolerance: Patient limited by fatigue (limited by BP.) Patient left: in bed;with call bell/phone within reach     Time: [id number] PT Time Calculation (min) (ACUTE ONLY): 23 min  Charges:  $Gait Training: 8-22 mins $Self Care/Home Management: 8-22                    G CodesTawni Millers:      Irisa Grimsley F 07/25/2014, 2:02 PM Altheria Shadoan,PT Acute Rehabilitation [phone] (pager)

## 2014-07-25 NOTE — Progress Notes (Signed)
Patient ID: Peggy Crawford, female   DOB: 01/16/1961, 54 y.o.   MRN: [id number] Subjective:  The patient is alert and pleasant. Her back is appropriately sore. She wants to go home tomorrow.  Objective: Vital signs in last 24 hours: Temp:  [97.5 F (36.4 C)-101.4 F (38.6 C)] 98.6 F (37 C) (02/24 1145) Pulse Rate:  [100-124] 113 (02/24 1145) Resp:  [18-20] 18 (02/24 1145) BP: (108-138)/(52-78) 138/78 mmHg (02/24 1145) SpO2:  [93 %-99 %] 96 % (02/24 1145)  Intake/Output from previous day: 02/23 0701 - 02/24 0700 In: 600 [P.O.:600] Out: 750 [Urine:750] Intake/Output this shift: Total I/O In: 120 [P.O.:120] Out: -   Physical exam the patient is alert and oriented 3. Her strength is grossly normal and her lower extremities. Her dressing has about fall off. Her wound is healing well without signs of infection.  Lab Results:  Recent Labs  07/24/14 0416  WBC 8.0  HGB 11.1*  HCT 35.5*  PLT 266   BMET  Recent Labs  07/24/14 0416  NA 139  K 3.7  CL 104  CO2 31  GLUCOSE 100*  BUN 8  CREATININE 0.93  CALCIUM 8.8    Studies/Results: Dg Lumbar Spine 2-3 Views  07/23/2014   CLINICAL DATA:  Spinal fusion at L4-L5. Chronic back pain. L4-L5 spondylolisthesis.  EXAM: DG C-ARM 61-120 MIN; LUMBAR SPINE - 2-3 VIEW  COMPARISON:  07/03/2014  FINDINGS: Placement of bilateral pedicle screws at L4 and L5. Interbody device at L4-L5.  IMPRESSION: Surgical fusion at L4-L5.   Electronically Signed   By: Richarda OverlieAdam  Henn M.D.   On: 07/23/2014 14:57   Dg C-arm 1-60 Min  07/23/2014   CLINICAL DATA:  Spinal fusion at L4-L5. Chronic back pain. L4-L5 spondylolisthesis.  EXAM: DG C-ARM 61-120 MIN; LUMBAR SPINE - 2-3 VIEW  COMPARISON:  07/03/2014  FINDINGS: Placement of bilateral pedicle screws at L4 and L5. Interbody device at L4-L5.  IMPRESSION: Surgical fusion at L4-L5.   Electronically Signed   By: Richarda OverlieAdam  Henn M.D.   On: 07/23/2014 14:57    Assessment/Plan: Postop day #2: The patient is doing  well. We will replace her dressing. She will likely come tomorrow.  LOS: 2 days     [name] D 07/25/2014, 12:51 PM

## 2014-07-26 MED ORDER — DOCUSATE SODIUM 100 MG PO CAPS: 100.0000 mg | ORAL_CAPSULE | Freq: Two times a day (BID) | ORAL | Status: AC

## 2014-07-26 MED ORDER — OXYCODONE-ACETAMINOPHEN 10-325 MG PO TABS: 1.0000 | ORAL_TABLET | ORAL | Status: AC | PRN

## 2014-07-26 MED ORDER — DIAZEPAM 5 MG PO TABS: 5.0000 mg | ORAL_TABLET | Freq: Four times a day (QID) | ORAL | Status: AC | PRN

## 2014-07-26 NOTE — Progress Notes (Signed)
Patient alert and oriented, mae's well, voiding adequate amount of urine, swallowing without difficulty, no c/o pain. Patient discharged home with family. Script and discharged instructions given to patient. Patient and family stated understanding of d/c instructions given and has an appointment with MD. Aisha Deron Poole RN 

## 2014-07-26 NOTE — Discharge Summary (Signed)
  Physician Discharge Summary  Patient ID: Gaylyn Cheersatricia A Lietz MRN: [id number] DOB/AGE: 53/07/1960 53 y.o.  Admit date: 07/23/2014 Discharge date: 07/26/2014  Admission Diagnoses: L4-5 spondylolisthesis, facet arthropathy, spinal stenosis, lumbago, lumbar radiculopathy, neurogenic claudication  Discharge Diagnoses: The same Active Problems:   Spondylolisthesis of lumbar region   Discharged Condition: good  Hospital Course: I performed an L4-5 decompression, instrumentation, and fusion on the patient on 07/23/2014. The surgery went well.  The patient's postoperative course was unremarkable. On postoperative day #3 the patient was ready to be discharged. Arrangements were made for home PT. The patient, and her daughter, were given oral and written discharge instructions. All her questions were answered.  Consults: PT Significant Diagnostic Studies: None Treatments: L4-5 decompression, instrumentation, and fusion. Discharge Exam: Blood pressure 123/74, pulse 110, temperature 98.6 F (37 C), temperature source Oral, resp. rate 18, height 5' 2.5" (1.588 m), weight 99.338 kg (219 lb), last menstrual period 10/30/2013, SpO2 97 %. The patient is alert and pleasant. Her strength is grossly normal in her lower extremities.  Disposition: Home  Discharge Instructions    Face-to-face encounter (required for Medicare/Medicaid patients)    Complete by:  As directed   I Pheng Prokop D certify that this patient is under my care and that I, or a nurse practitioner or physician's assistant working with me, had a face-to-face encounter that meets the physician face-to-face encounter requirements with this patient on 07/26/2014. The encounter with the patient was in whole, or in part for the following medical condition(s) which is the primary reason for home health care (List medical condition): Lumbar spondylolisthesis  The encounter with the patient was in whole, or in part, for the following medical  condition, which is the primary reason for home health care:  Lumbar spondylolisthesis  I certify that, based on my findings, the following services are medically necessary home health services:  Physical therapy  Reason for Medically Necessary Home Health Services:  Therapy- Home Adaptation to Facilitate Safety  My clinical findings support the need for the above services:  Unable to leave home safely without assistance and/or assistive device  Further, I certify that my clinical findings support that this patient is homebound due to:  Unable to leave home safely without assistance     Home Health    Complete by:  As directed   To provide the following care/treatments:  PT            Medication List    STOP taking these medications        diclofenac 75 MG EC tablet  Commonly known as:  VOLTAREN      TAKE these medications        diazepam 5 MG tablet  Commonly known as:  VALIUM  Take 1 tablet (5 mg total) by mouth every 6 (six) hours as needed for muscle spasms.     docusate sodium 100 MG capsule  Commonly known as:  COLACE  Take 1 capsule (100 mg total) by mouth 2 (two) times daily.     gabapentin 300 MG capsule  Commonly known as:  NEURONTIN  Take 300 mg by mouth 2 (two) times daily.     oxyCODONE-acetaminophen 10-325 MG per tablet  Commonly known as:  PERCOCET  Take 1 tablet by mouth every 4 (four) hours as needed for pain.         SignedCristi Loron: Francina Beery D 07/26/2014, 9:48 AM

## 2014-07-26 NOTE — Progress Notes (Signed)
Physical Therapy Treatment Patient Details Name: Peggy Crawford MRN: [id number] DOB: 07/16/1960 Today's Date: 07/26/2014    History of Present Illness pt presents with L4-5 PLIF.      PT Comments    Pt admitted with above diagnosis. Pt currently with functional limitations due to balance and endurance deficits. Pt to continue HHPT at home to reenforce precautions and progress mobility.   Pt will benefit from skilled PT to increase their independence and safety with mobility to allow discharge to the venue listed below.    Follow Up Recommendations  Home health PT;Supervision - Intermittent     Equipment Recommendations  Rolling walker with 5" wheels;3in1 (PT) (shower equipment)    Recommendations for Other Services       Precautions / Restrictions Precautions Precautions: Back;Fall Precaution Booklet Issued: Yes (comment) Precaution Comments: Reviewed back precautions. Required Braces or Orthoses: Spinal Brace Spinal Brace: Lumbar corset;Applied in sitting position;Applied in standing position Restrictions Weight Bearing Restrictions: No    Mobility  Bed Mobility               General bed mobility comments: Pt in chair on arrival.  Did not want to practice bed mobility.    Transfers Overall transfer level: Needs assistance Equipment used: Rolling walker (2 wheeled) Transfers: Sit to/from Stand Sit to Stand: Modified independent (Device/Increase time)         General transfer comment: Slow to power up but could do without assist.    Ambulation/Gait Ambulation/Gait assistance: Supervision Ambulation Distance (Feet): 300 Feet Assistive device: Rolling walker (2 wheeled) Gait Pattern/deviations: Step-through pattern;Decreased stride length;Antalgic;Wide base of support   Gait velocity interpretation: Below normal speed for age/gender General Gait Details: PPt did not need assist or cues.  Slow and guarded gait but no LOB.     Stairs Stairs: Yes Stairs  assistance: Supervision Stair Management: Step to pattern;Forwards;One rail Left Number of Stairs: 4 General stair comments: Pt able to ascend and descent steps without assist or cues.    Wheelchair Mobility    Modified Rankin (Stroke Patients Only)       Balance Overall balance assessment: Needs assistance Sitting-balance support: No upper extremity supported;Feet supported Sitting balance-Leahy Scale: Fair     Standing balance support: Bilateral upper extremity supported;During functional activity Standing balance-Leahy Scale: Fair Standing balance comment: cqan stand statically without assist.             High level balance activites: Direction changes;Turns High Level Balance Comments: Needs supervision only for above.     Cognition Arousal/Alertness: Awake/alert Behavior During Therapy: WFL for tasks assessed/performed Overall Cognitive Status: Within Functional Limits for tasks assessed                      Exercises      General Comments        Pertinent Vitals/Pain Pain Assessment: 0-10 Pain Score: 5  Pain Location: back Pain Descriptors / Indicators: Aching Pain Intervention(s): Limited activity within patient's tolerance;Monitored during session;Premedicated before session;Repositioned  VSS    Home Living                      Prior Function            PT Goals (current goals can now be found in the care plan section) Progress towards PT goals: Progressing toward goals    Frequency  Min 5X/week    PT Plan Current plan remains appropriate    Co-evaluation  End of Session Equipment Utilized During Treatment: Gait belt;Back brace Activity Tolerance: Patient tolerated treatment well Patient left: in chair;with call bell/phone within reach;with family/visitor present     Time: 0912-0935 PT Time Calculation (min) (ACUTE ONLY): 23 min  Charges:  $Gait Training: 8-22 mins $Self Care/Home Management:  8-22                    G CodesBerline Lopes 24-Aug-2014, 1:08 PM Scarlettrose Costilow,PT Acute Rehabilitation [phone] (pager)

## 2014-07-26 NOTE — Progress Notes (Signed)
Occupational Therapy Treatment Patient Details Name: Peggy Crawford MRN: [id number] DOB: Sep 12, 1960 Today's Date: 07/26/2014    History of present illness pt presents with L4-5 PLIF.     OT comments  Pt seen today for ADL session. Pt hopes to d/c home today and feel that she would be safe for d/c with 24/7 assist. Educated on fall prevention and compensatory techniques given back precautions. Pt will have assistance from her daughter at d/c. No further acute OT needs.    Follow Up Recommendations  No OT follow up;Supervision/Assistance - 24 hour    Equipment Recommendations  3 in 1 bedside comode;Tub/shower bench    Recommendations for Other Services      Precautions / Restrictions Precautions Precautions: Back;Fall Precaution Comments: Reviewed back precautions. Required Braces or Orthoses: Spinal Brace Spinal Brace: Lumbar corset;Applied in sitting position;Applied in standing position Restrictions Weight Bearing Restrictions: No       Mobility Bed Mobility Overal bed mobility: Needs Assistance Bed Mobility: Rolling;Sidelying to Sit Rolling: Min guard Sidelying to sit: Min guard       General bed mobility comments: Min guard for safety with bed mobility. HOB flat and no use of rail. Good technique.   Transfers Overall transfer level: Needs assistance Equipment used: Rolling walker (2 wheeled) Transfers: Sit to/from Stand Sit to Stand: Min guard         General transfer comment: Min guard for safety. Slow to power up, however no physical assist needed.         ADL Overall ADL's : Needs assistance/impaired     Grooming: Supervision/safety;Set up;Standing           Upper Body Dressing : Set up;Sitting       Toilet Transfer: Min guard;Ambulation;RW;Comfort height toilet;Grab bars   Toileting- Clothing Manipulation and Hygiene: Supervision/safety;Sit to/from stand       Functional mobility during ADLs: Min guard;Rolling walker General ADL  Comments: Pt recalled ways to incorporate back precautions into ADLs. Further educated on compensatory techniques. Pt plans to wear night gowns and slide on shoes for ease of dressing independently.                 Cognition  Arousal/Alertness: Awake/Alert Behavior During Therapy: WFL for tasks assessed/performed Overall Cognitive Status: Within Functional Limits for tasks assessed                                    Pertinent Vitals/ Pain       Pain Assessment: Faces Faces Pain Scale: Hurts even more Pain Location: back Pain Descriptors / Indicators: Aching Pain Intervention(s): Limited activity within patient's tolerance;Monitored during session;Repositioned         Frequency Min 2X/week     Progress Toward Goals  OT Goals(current goals can now be found in the care plan section)  Progress towards OT goals: Progressing toward goals  Acute Rehab OT Goals Patient Stated Goal: home today OT Goal Formulation: With patient Time For Goal Achievement: 08/01/14 Potential to Achieve Goals: Good  Plan Discharge plan remains appropriate       End of Session Equipment Utilized During Treatment: Rolling walker;Back brace   Activity Tolerance Patient tolerated treatment well   Patient Left in chair;with call bell/phone within reach   Nurse Communication Mobility status;Other (comment) (d/c recommendation)        Time: 0830-0900 OT Time Calculation (min): 30 min  Charges: OT General Charges $OT Visit:  1 Procedure OT Treatments $Self Care/Home Management : 23-37 mins  Rae LipsMiller, Mohsen Odenthal M 07/26/2014, 9:30 AM  Carney LivingLeeAnn Marie Laurann Mcmorris, OTR/L Occupational Therapist [phone] (pager)

## 2014-10-23 ENCOUNTER — Other Ambulatory Visit: Payer: Self-pay | Admitting: Internal Medicine

## 2014-10-23 DIAGNOSIS — R748 Abnormal levels of other serum enzymes: Secondary | ICD-10-CM

## 2014-10-26 ENCOUNTER — Ambulatory Visit
Admission: RE | Admit: 2014-10-26 | Discharge: 2014-10-26 | Disposition: A | Discharge status: Home / Self Care | Payer: No Typology Code available for payment source | Source: Ambulatory Visit | Attending: Internal Medicine | Admitting: Internal Medicine

## 2014-10-26 DIAGNOSIS — R748 Abnormal levels of other serum enzymes: Secondary | ICD-10-CM | POA: Diagnosis present

## 2015-10-11 IMAGING — CR DG LUMBAR SPINE 2-3V
1 series · 3 of 3 positions shown · non-contrast
Comparison: None.

CLINICAL DATA: Left side neck and shoulder pain x 3 days, left
lower back pain radiating down left leg, back injury several years
ago, no recent injury

EXAM:
LUMBAR SPINE - 2-3 VIEW

[Series 1: ap · 0.17mm/px · 3 of 3 slices shown]
[im 1/3]
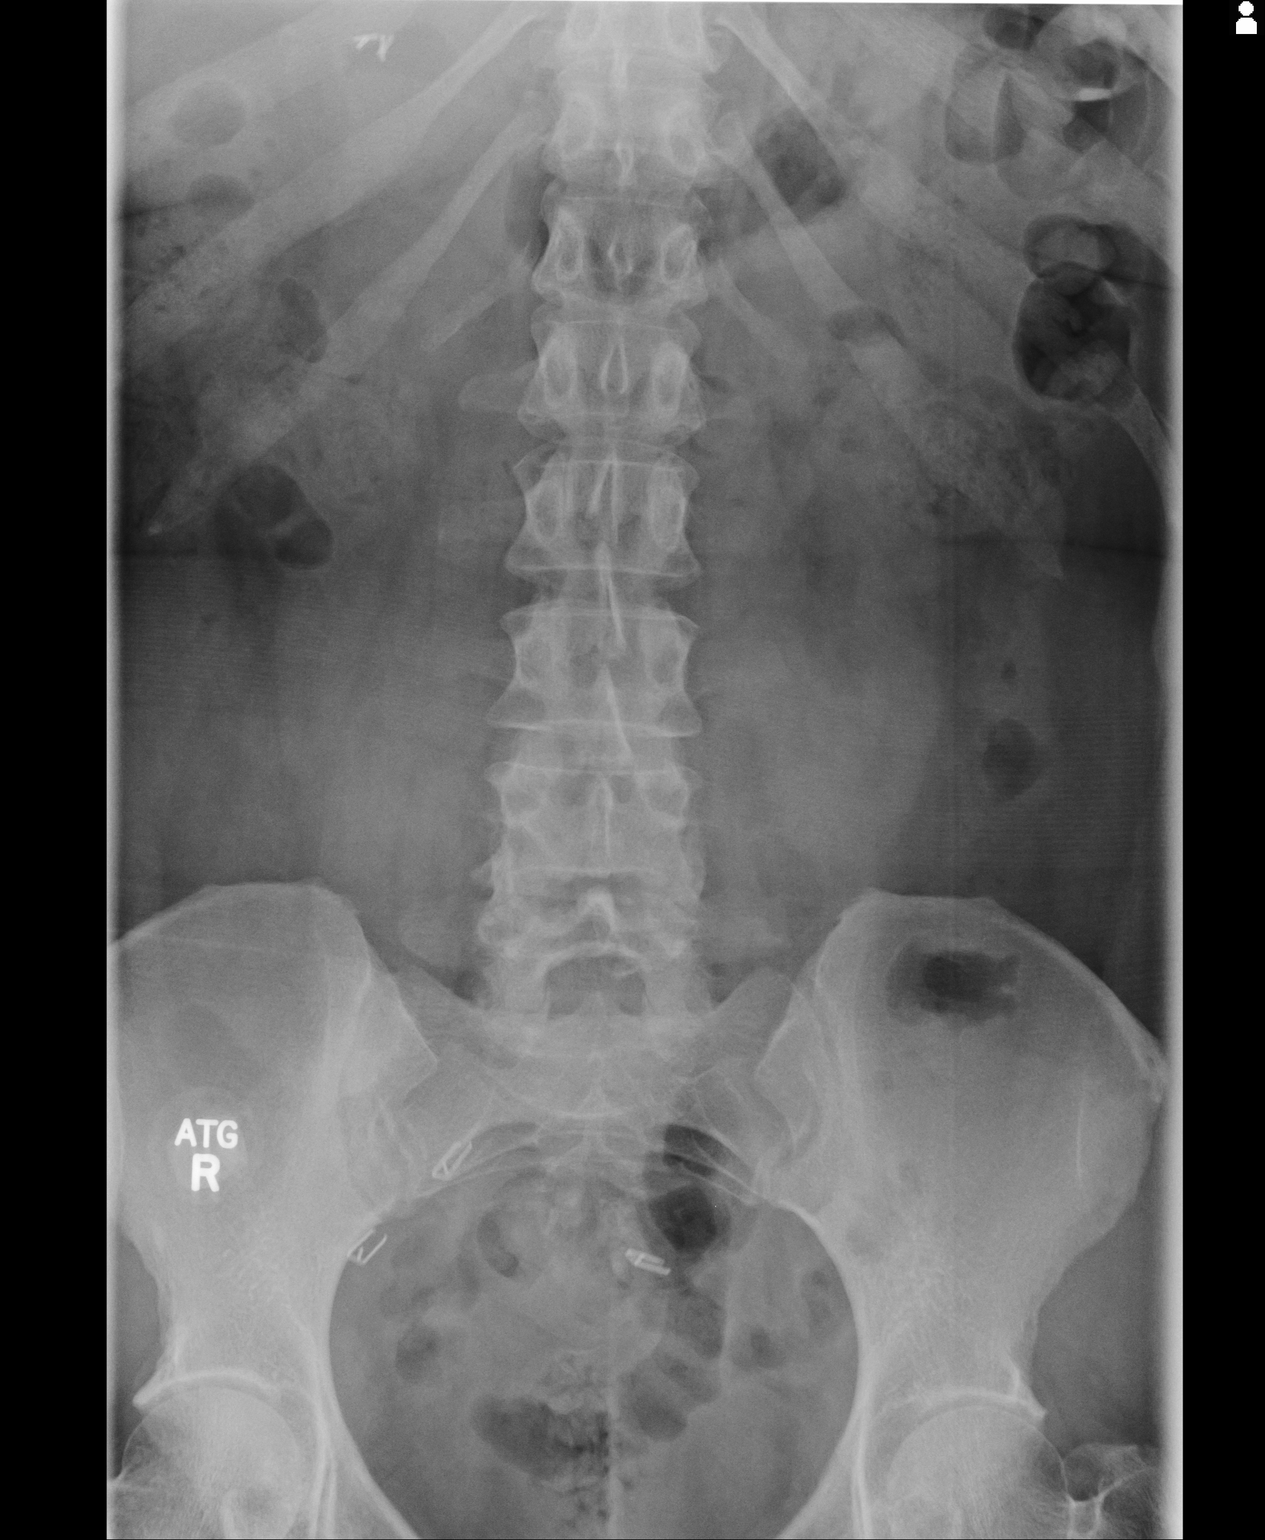
[im 2/3]
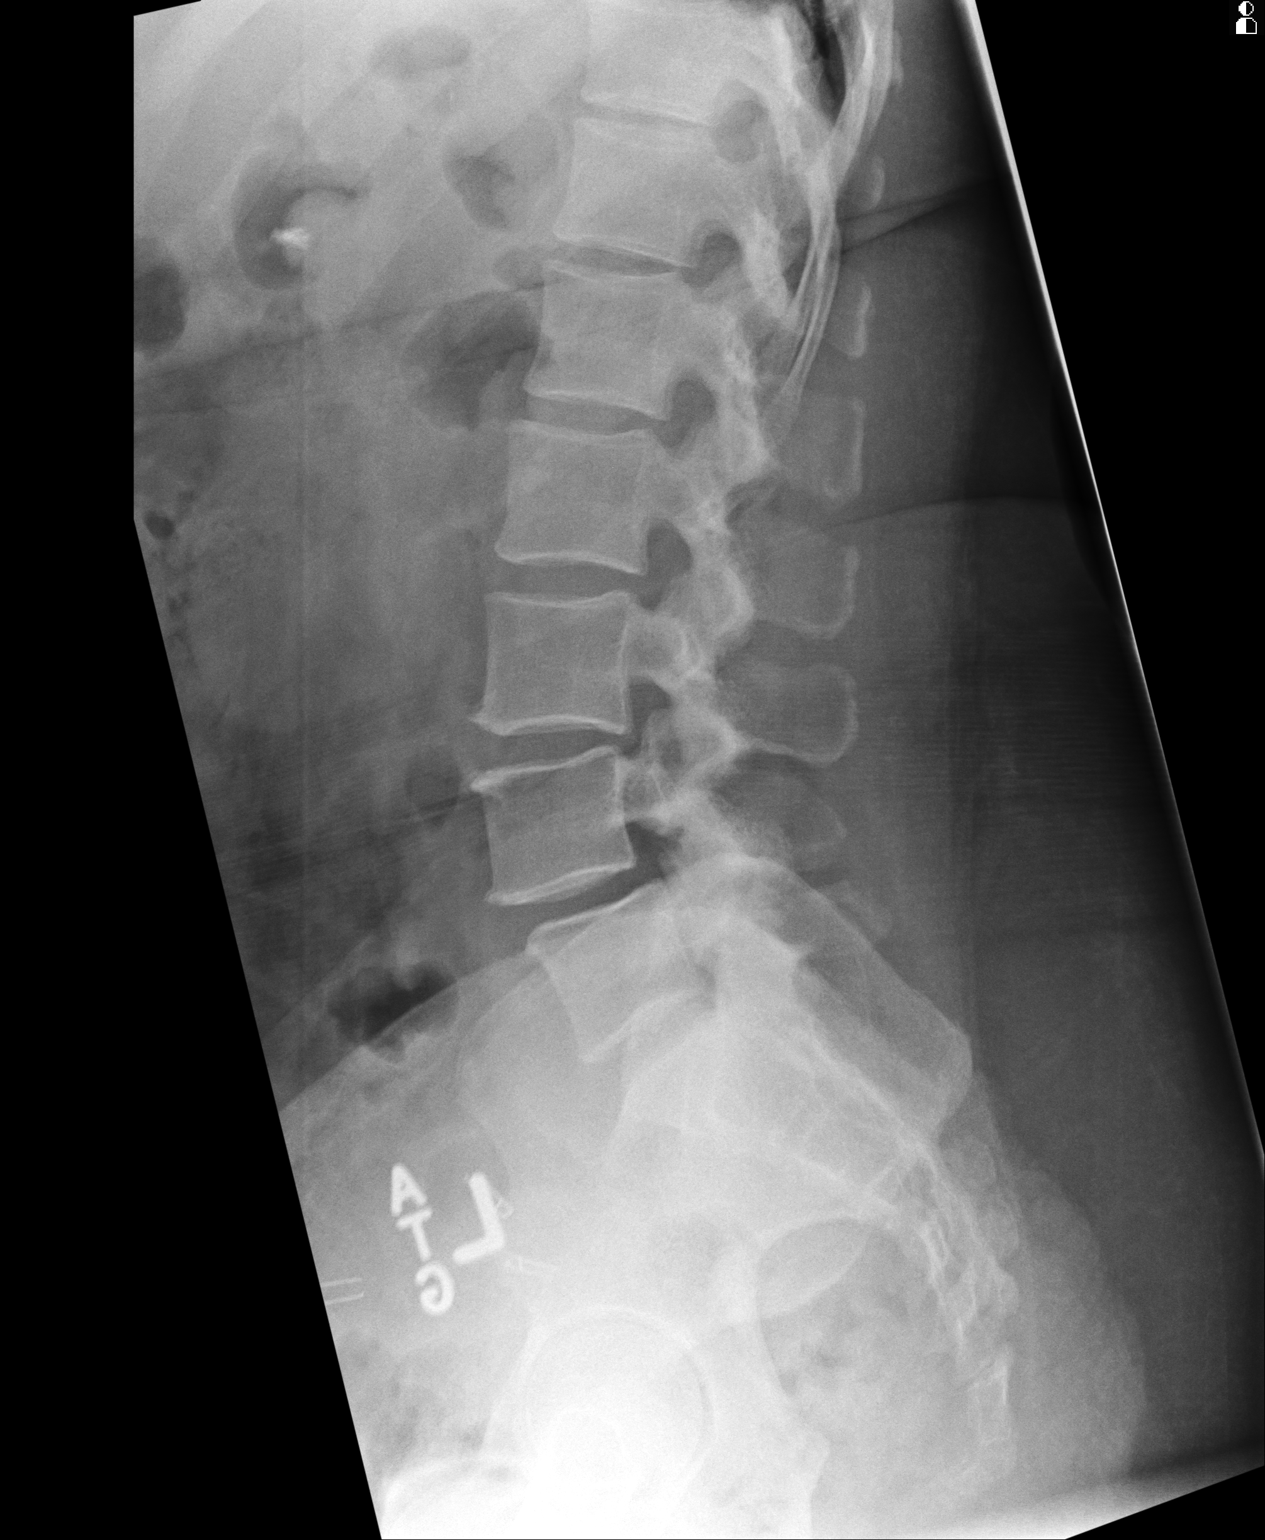
[im 3/3]
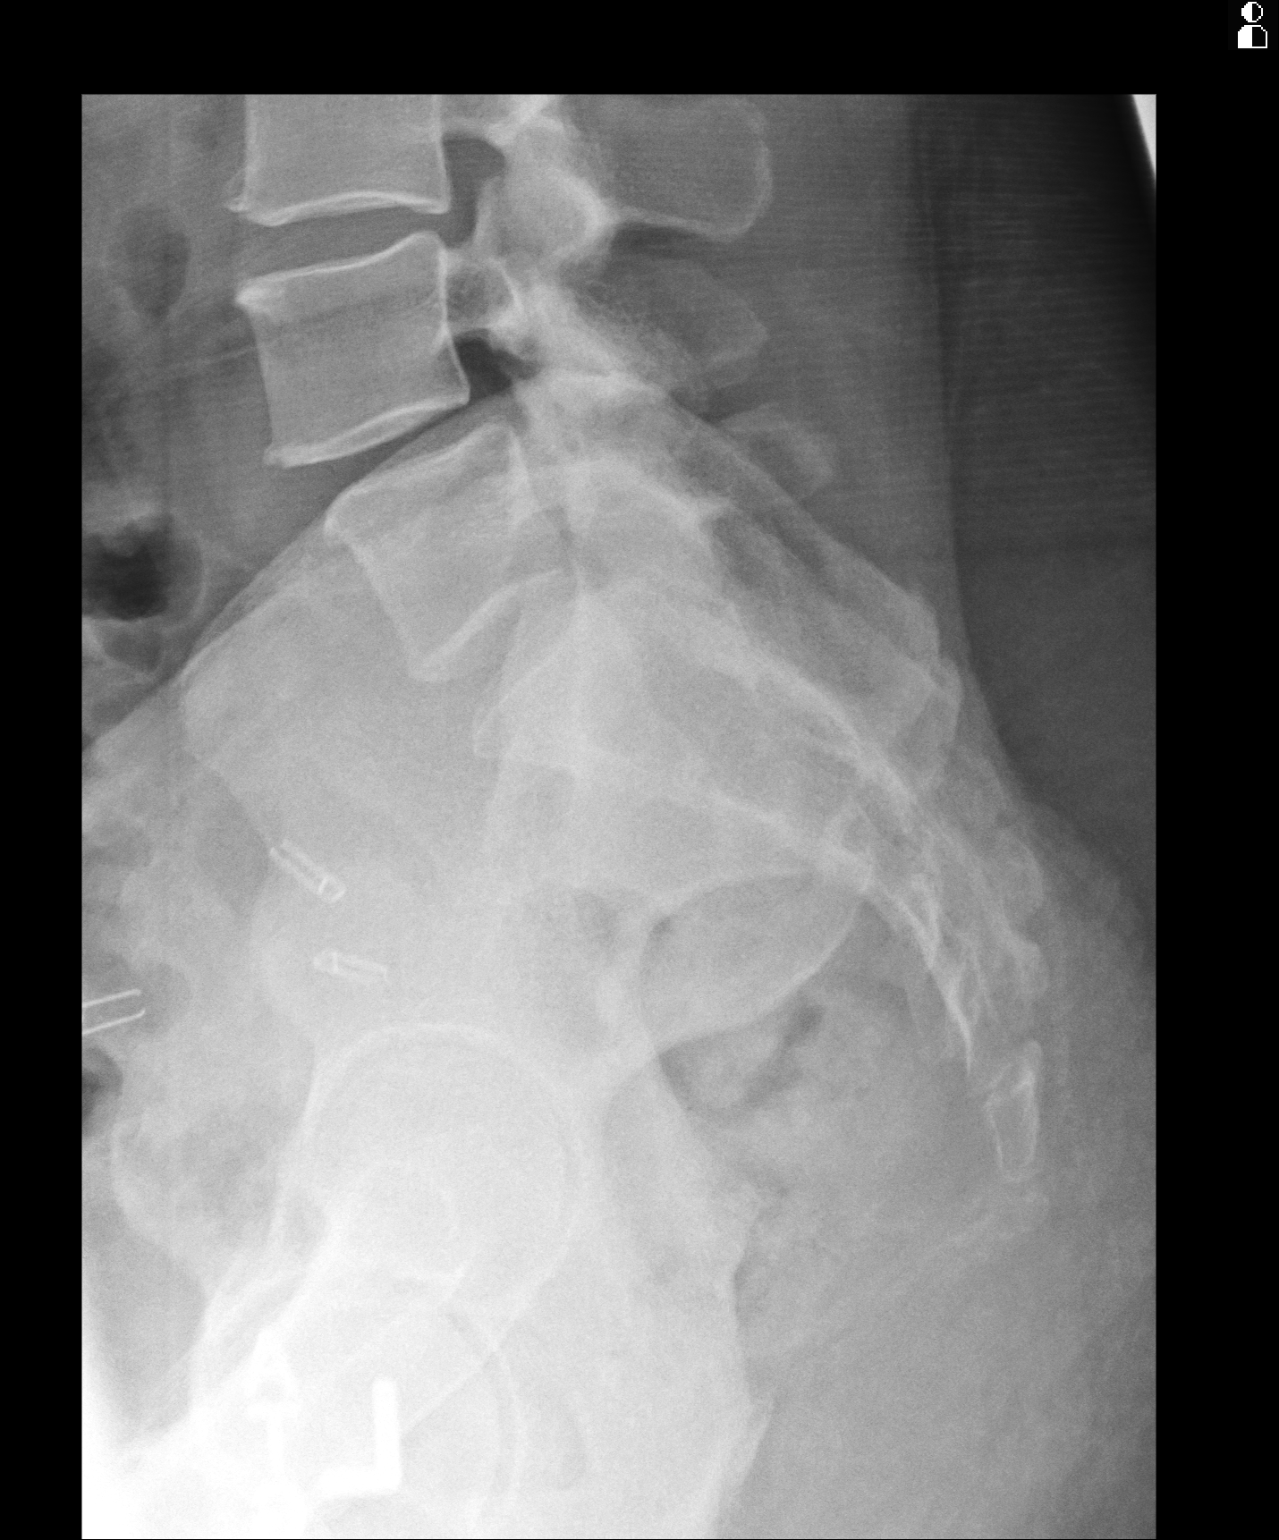

[3 of 3 positions shown; findings below may reference images not displayed]

FINDINGS: There are 5 nonrib bearing lumbar-type vertebral bodies. The
vertebral body heights are maintained. There is grade 1
anterolisthesis of L4 on L5 likely secondary to facet arthropathy.
There is no spondylolysis. There is no acute fracture. There is mild
degenerative disc disease at L3-4 and L4-5. There is facet
arthropathy at L4-5 and L5-S1.

The SI joints are unremarkable.
IMPRESSION: 1. Mild degenerative disc disease at L3-4 and L4-5.
2. Grade 1 anterolisthesis of L4 on L5 likely secondary to facet
arthropathy.

## 2016-11-27 IMAGING — CR DG CHEST 1V PORT
1 series · 1 of 1 positions shown · non-contrast
Comparison: None.

CLINICAL DATA: Sudden onset fever tonight. Spine surgery on [REDACTED].

EXAM:
PORTABLE CHEST - 1 VIEW

[AP]
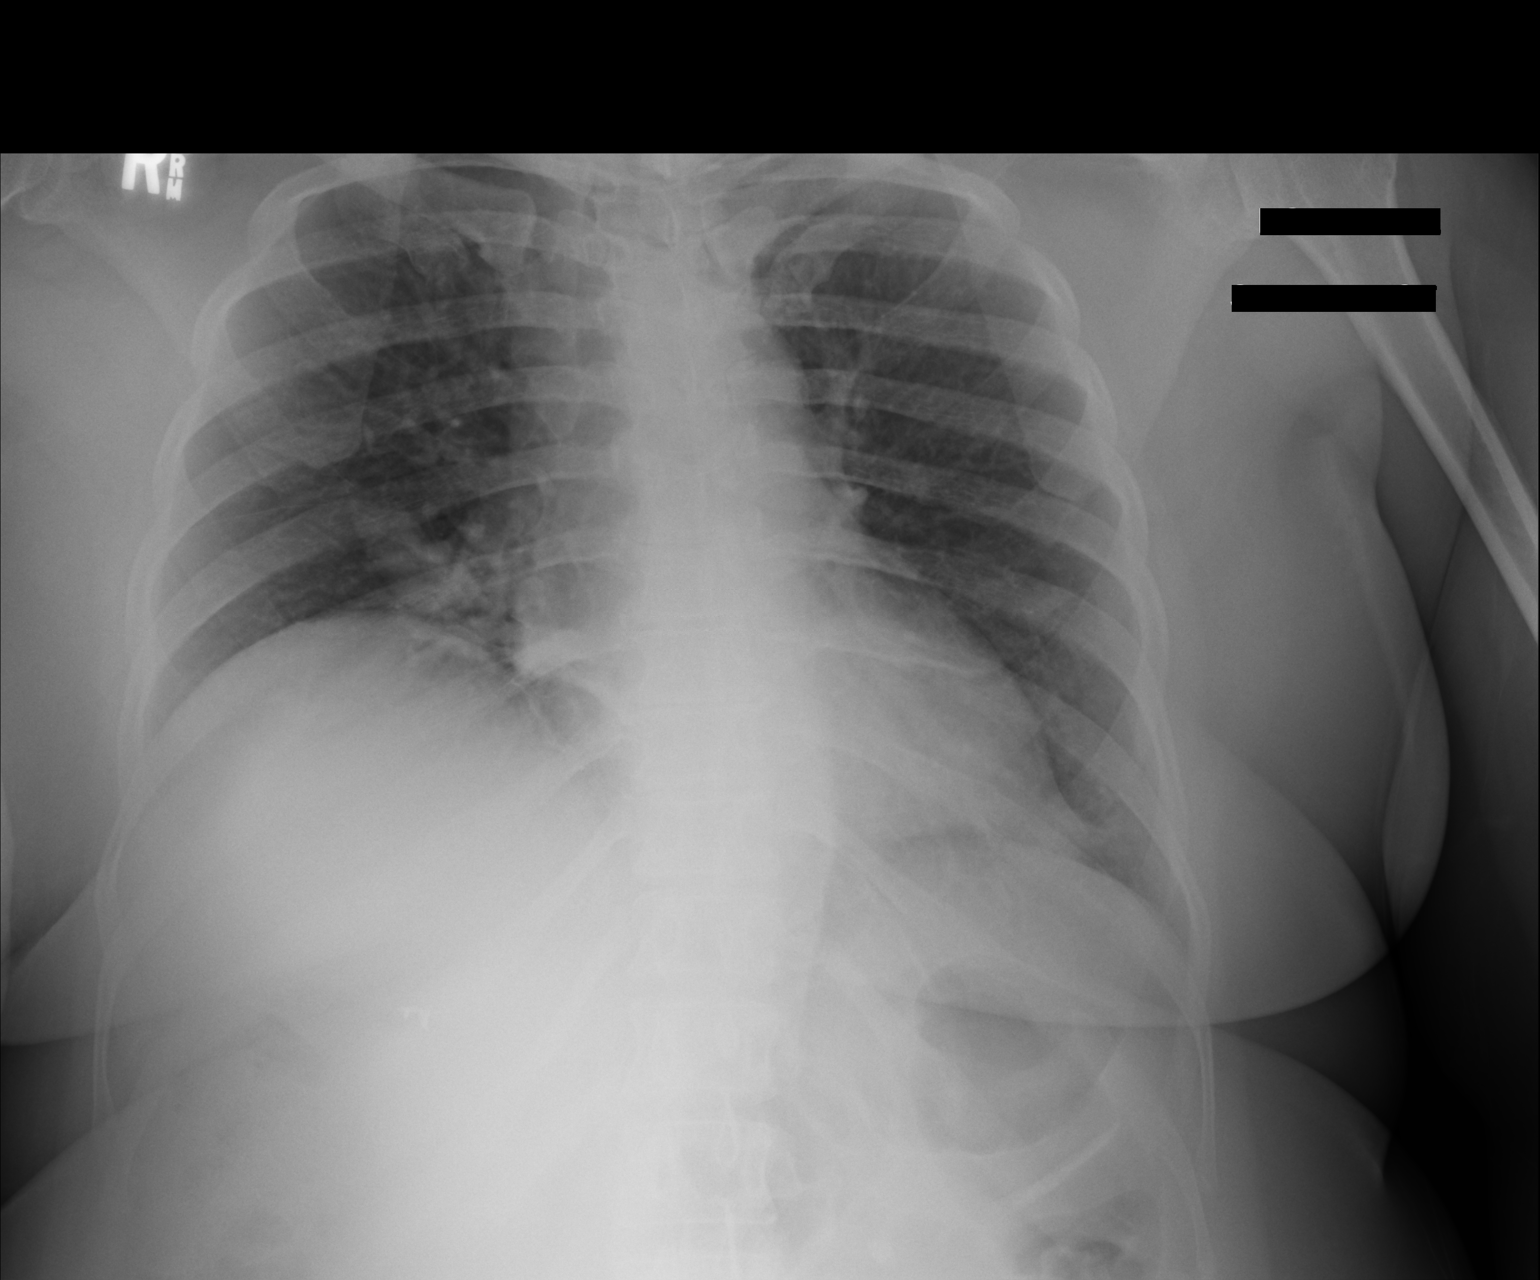

[1 of 1 positions shown; findings below may reference images not displayed]

FINDINGS: Shallow inspiration with elevation of the right hemidiaphragm.
Atelectasis in the right lung base. Heart size and pulmonary
vascularity are normal. No blunting of costophrenic angles. No
pneumothorax. Surgical clips in the right upper quadrant.
IMPRESSION: Shallow inspiration with elevation of right hemidiaphragm and
atelectasis in the right lung base.
